# Patient Record
Sex: Female | Born: 1995 | Race: Black or African American | Hispanic: No | Marital: Single | Smoking: Never smoker
Health system: Southern US, Community
[De-identification: ages and names within clinical notes are randomized; demographics above are authoritative.]

## PROBLEM LIST (undated history)

## (undated) DIAGNOSIS — A6000 Herpesviral infection of urogenital system, unspecified: Secondary | ICD-10-CM

## (undated) DIAGNOSIS — N83209 Unspecified ovarian cyst, unspecified side: Secondary | ICD-10-CM

## (undated) DIAGNOSIS — N946 Dysmenorrhea, unspecified: Secondary | ICD-10-CM

## (undated) HISTORY — DX: Dysmenorrhea, unspecified: N94.6

## (undated) HISTORY — DX: Herpesviral infection of urogenital system, unspecified: A60.00

## (undated) HISTORY — PX: DILATION AND CURETTAGE OF UTERUS: SHX78

## (undated) HISTORY — DX: Unspecified ovarian cyst, unspecified side: N83.209

---

## 2014-02-03 ENCOUNTER — Encounter (HOSPITAL_COMMUNITY): Payer: Self-pay | Admitting: Emergency Medicine

## 2014-02-03 ENCOUNTER — Emergency Department (HOSPITAL_COMMUNITY)
Admission: EM | Admit: 2014-02-03 | Discharge: 2014-02-03 | Disposition: A | Discharge status: Home / Self Care | Payer: No Typology Code available for payment source | Attending: Emergency Medicine | Admitting: Emergency Medicine

## 2014-02-03 DIAGNOSIS — IMO0002 Reserved for concepts with insufficient information to code with codable children: Secondary | ICD-10-CM | POA: Insufficient documentation

## 2014-02-03 DIAGNOSIS — Y92410 Unspecified street and highway as the place of occurrence of the external cause: Secondary | ICD-10-CM | POA: Diagnosis not present

## 2014-02-03 DIAGNOSIS — S8990XA Unspecified injury of unspecified lower leg, initial encounter: Secondary | ICD-10-CM | POA: Diagnosis present

## 2014-02-03 DIAGNOSIS — Y9389 Activity, other specified: Secondary | ICD-10-CM | POA: Diagnosis not present

## 2014-02-03 DIAGNOSIS — M79661 Pain in right lower leg: Secondary | ICD-10-CM

## 2014-02-03 DIAGNOSIS — S298XXA Other specified injuries of thorax, initial encounter: Secondary | ICD-10-CM | POA: Insufficient documentation

## 2014-02-03 DIAGNOSIS — S99919A Unspecified injury of unspecified ankle, initial encounter: Principal | ICD-10-CM

## 2014-02-03 DIAGNOSIS — S199XXA Unspecified injury of neck, initial encounter: Secondary | ICD-10-CM

## 2014-02-03 DIAGNOSIS — S0993XA Unspecified injury of face, initial encounter: Secondary | ICD-10-CM | POA: Insufficient documentation

## 2014-02-03 DIAGNOSIS — S99929A Unspecified injury of unspecified foot, initial encounter: Principal | ICD-10-CM

## 2014-02-03 DIAGNOSIS — R0789 Other chest pain: Secondary | ICD-10-CM

## 2014-02-03 MED ORDER — IBUPROFEN 800 MG PO TABS: 800.0000 mg | ORAL_TABLET | Freq: Three times a day (TID) | ORAL | Status: DC | PRN

## 2014-02-03 NOTE — ED Provider Notes (Signed)
Medical screening examination/treatment/procedure(s) were performed by non-physician practitioner and as supervising physician I was immediately available for consultation/collaboration.    Jaden Abreu D Lesle Faron, MD 02/03/14 1904 
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
## 2014-02-03 NOTE — Discharge Instructions (Signed)
Read the information below.  Use the prescribed medication as directed.  Please discuss all new medications with your pharmacist.  You may return to the Emergency Department at any time for worsening condition or any new symptoms that concern you.  If there is any possibility that you might be pregnant, please let your health care provider know and discuss this with the pharmacist to ensure medication safety.  If you develop worsening chest wall pain, shortness of breath, fever, you pass out, or become weak or dizzy, return to the ER for a recheck.     Musculoskeletal Pain Musculoskeletal pain is muscle and boney aches and pains. These pains can occur in any part of the body. Your caregiver may treat you without knowing the cause of the pain. They may treat you if blood or urine tests, X-rays, and other tests were normal.  CAUSES There is often not a definite cause or reason for these pains. These pains may be caused by a type of germ (virus). The discomfort may also come from overuse. Overuse includes working out too hard when your body is not fit. Boney aches also come from weather changes. Bone is sensitive to atmospheric pressure changes. HOME CARE INSTRUCTIONS   Ask when your test results will be ready. Make sure you get your test results.  Only take over-the-counter or prescription medicines for pain, discomfort, or fever as directed by your caregiver. If you were given medications for your condition, do not drive, operate machinery or power tools, or sign legal documents for 24 hours. Do not drink alcohol. Do not take sleeping pills or other medications that may interfere with treatment.  Continue all activities unless the activities cause more pain. When the pain lessens, slowly resume normal activities. Gradually increase the intensity and duration of the activities or exercise.  During periods of severe pain, bed rest may be helpful. Lay or sit in any position that is comfortable.  Putting  ice on the injured area.  Put ice in a bag.  Place a towel between your skin and the bag.  Leave the ice on for 15 to 20 minutes, 3 to 4 times a day.  Follow up with your caregiver for continued problems and no reason can be found for the pain. If the pain becomes worse or does not go away, it may be necessary to repeat tests or do additional testing. Your caregiver may need to look further for a possible cause. SEEK IMMEDIATE MEDICAL CARE IF:  You have pain that is getting worse and is not relieved by medications.  You develop chest pain that is associated with shortness or breath, sweating, feeling sick to your stomach (nauseous), or throw up (vomit).  Your pain becomes localized to the abdomen.  You develop any new symptoms that seem different or that concern you. MAKE SURE YOU:   Understand these instructions.  Will watch your condition.  Will get help right away if you are not doing well or get worse. Document Released: 04/28/2005 Document Revised: 07/21/2011 Document Reviewed: 12/31/2012 [facility] Patient Information 2015 [place], [place]. This information is not intended to replace advice given to you by your health care provider. Make sure you discuss any questions you have with your health care provider.    Emergency Department Resource Guide 1) Find a Doctor and Pay Out of Pocket Although you won't have to find out who is covered by your insurance plan, it is a good idea to ask around and get recommendations. You will  then need to call the office and see if the doctor you have chosen will accept you as a new patient and what types of options they offer for patients who are self-pay. Some doctors offer discounts or will set up payment plans for their patients who do not have insurance, but you will need to ask so you aren't surprised when you get to your appointment.  2) Contact Your Local Health Department Not all health departments have doctors that can see patients for  sick visits, but many do, so it is worth a call to see if yours does. If you don't know where your local health department is, you can check in your phone book. The CDC also has a tool to help you locate your state's health department, and many state websites also have listings of all of their local health departments.  3) Find a Walk-in Clinic If your illness is not likely to be very severe or complicated, you may want to try a walk in clinic. These are popping up all over the country in pharmacies, drugstores, and shopping centers. They're usually staffed by nurse practitioners or physician assistants that have been trained to treat common illnesses and complaints. They're usually fairly quick and inexpensive. However, if you have serious medical issues or chronic medical problems, these are probably not your best option.  No Primary Care Doctor: - Call Health Connect at  [phone] - they can help you locate a primary care doctor that  accepts your insurance, provides certain services, etc. - Physician Referral Service- [phone]  Chronic Pain Problems: Organization         Address  Phone   Notes  [facility]  [phone] Patients need to be referred by their primary care doctor.   Medication Assistance: Organization         Address  Phone   Notes  [facility] Medication [facility] [address] [place], [place] [address] [phone] --Must be a resident of [facility] -- Must have NO insurance coverage whatsoever (no Medicaid/ Medicare, etc.) -- The pt. MUST have a primary care doctor that directs their care regularly and follows them in the community   [facility]ist  [phone]   [employer]  [phone]    Agencies that provide inexpensive medical care: Organization         Address  Phone   Notes  [facility]  [phone]   [facility]    [phone]   [facility]  [address] [place], [place] [address] [phone]   [facility] [address] [place] [place] [phone]   Planned Parenthood    [phone]   [facility]    [phone]   [facility]  [address], [place] Phone:  [phone], Fax:  [phone] Hours of Operation:  9 am - 6 pm, M-F.  Also accepts Medicaid/Medicare and self-pay.  [facility]  [address], [place] Phone: [phone], Fax: [phone]. Hours of Operation:  8:30 am - 5:30 pm, M-F.  Also accepts Medicaid and self-pay.  [facility] [place] [address], [place] Phone: [phone]   [facility] [address] Natasha Bence [place], [place] [phone], Ext. [phone] Mondays & Thursdays: 7-9 AM.  First 15 patients are seen on a first come, first serve  basis.    Medicaid-accepting [facility] Providers:  Organization         Address  Phone   Notes  [facility] [address], [place] [phone] Also accepts self-pay patients.  [facility] [address] Laurell Josephs [place], [place]  [phone]   [facility] [place] [facility] [address], [place] [phone]   [facility] [address], [place] [phone]   Renaye Rakers [address], [place]   [phone] Only accepts [place] Access [place] patients after they have their name applied to their card.   Self-Pay (no insurance) in [facility]:  Organization         Address  Phone   Notes  Sickle Cell Patients, [facility] [address] [place], [place] [phone]   [facility] [address] [place], [place] [phone]   [facility] [place]  [address], [place] [phone]   [facility]/Dr. Osei-Bonsu  [address], [place] or [address], [place] [phone] Phone number for both [place] and [place] locations is the same.  [facility] and [facility] [address] [place] [phone]   [facility] [address], [place] or [address] [phone]   [facility] [address] [place] [phone], phone; [phone], fax Sees patients 1st and 3rd Saturday of every month.  Must not qualify for public or private insurance (i.e. Medicaid, Medicare, [place] Health Choice, Veterans' Benefits)  Household income should be no more than 200% of the poverty level The clinic cannot treat you if you are pregnant or think you are pregnant  Sexually transmitted diseases are not treated at the clinic.    Dental Care: Organization         Address  Phone  Notes  [facility] Department of [facility] [address] [place], [place] [phone] Accepts children up to age 74 who are enrolled in [place] or [place] Health Choice; pregnant women with a Medicaid card; and children who have applied for Medicaid or [place] Health Choice, but were declined, whose parents can pay a reduced fee at time of service.  [facility] of [facility]  [address], [place] [phone] Accepts children up to age 52 who are enrolled in [place] or [place] Health Choice; pregnant women with a Medicaid card; and children who have applied for Medicaid or [place] Health Choice, but were declined, whose parents can pay a reduced fee at time of service.  [facility]  [address] [place], [place] [phone] Patients are seen by appointment only. Walk-ins are not accepted. [facility] will see patients 23 years of age and older. Monday - Tuesday (8am-5pm) Most Wednesdays (8:30-5pm) $30 per visit, cash only  [facility]  [address], [facility] [phone] Patients are seen by appointment only. Walk-ins are not accepted. [facility] will see patients 79 years of age and older. One Wednesday Evening (Monthly: Volunteer Based).  $30 per visit, cash only  [employer]  [phone] for adults; Children under age 24, call Graduate Pediatric Dentistry at [phone]. Children aged 59-14, please call [phone] to request a pediatric application.  Dental services are provided in all areas of dental care including fillings, crowns and bridges, complete and partial dentures, implants, gum treatment, root canals, and extractions. Preventive care is also provided. Treatment is provided to both adults and children. Patients are selected via a lottery and there is often a waiting list.   [facility] [address] [place]  [phone] [email or web address]   [facility] [address] [place], [place] [phone], Ext. [phone] Second and Fourth Thursday of each month, opens at 6:30 AM; Clinic ends at 9 AM.  Patients are seen on a first-come first-served basis, and a limited number are seen during each clinic.   [facility]  [address] Ether Griffins [place], [place] [phone]   Eligibility Requirements You must have lived in [place], [place], or [place] counties for at least the last three months.   You cannot be eligible for state or federal sponsored [place], including [employer], [place], or [employer].   You generally cannot be eligible for healthcare insurance through your employer.    How to apply: Eligibility screenings are held every Tuesday and Wednesday afternoon from 1:00 pm until 4:00 pm. You do not need an appointment for the interview!  [facility] [address], [place], [place] [phone]   [facility] Health Department  [phone]   [facility] Health Department  [phone]     [facility] Health Department  [phone]    Behavioral Health Resources in the Community: Intensive Outpatient Programs Organization         Address  Phone  Notes  [facility] [address], [place], [place] [phone]   [facility] Outpatient [address], [place], [place] [phone]   [facility] [address] [place], [place]  [phone]   [facility] Mental Health [address]  [place], [place] [phone] or [phone]   Substance Abuse Resources Organization         Address  Phone  Notes  [facility] Drug [facility]  [phone]   [facility]  [phone]   [facility] [place]  [phone]   Floydene Flock  [phone]   Residential & Outpatient Substance Abuse Program  [phone]   Psychological Services Organization         Address  Phone  Notes  [facility] Behavioral Health  [phone]   [facility]  [phone]   [facility] Mental Health [address], [place] [phone] or [phone]    Mobile Crisis Teams Organization         Address  Phone  Notes  [facility], Mobile Crisis Care Unit  [phone]   [facility]  [address] [place], [place] [phone]   Doristine Locks [address] [place] [phone]    Self-Help/Support Groups Organization         Address  Phone             Notes  [facility] [place] - variety of support groups  [phone]- I7437963 Call for more information  Narcotics Anonymous (NA), [facility] [address], [employer] [place]  2 meetings at this location   Statistician         Address  Phone  Notes  [facility] [address],    [place]  [phone]   [facility]  [phone], [place], [place] [phone]   [facility] [address], [place]  [phone] Admissions: 8am-3pm M-F  [facility] [address]    [place], [place] [phone]   [facility] [address], [place], [place] [phone]   [facility] [address]  [place], [place] [phone]   [facility] - Intensive Outpatient [address], [place], [place] [phone]   [facility] ([facility] [address]  [place], [place] [phone] or [phone]   [facility] ([facility]) [address] [place], [place] [phone] Accepts Medicaid  [facility] [address]  [place] [phone] Substance Abuse/Addiction Treatment   [facility] Behavioral Health Resources Organization         Address  Phone  Notes  [facility]  [phone](888) [phone]   Angie FavaJulie Brannon, PhD [address] [place], [place]   [phone](336) [phone] or [phone]) [phone]   [facility]   [address] [place], [place] [phone]) [phone]   [facility] [address], [place], [place] [phone]) [phone] Insurance/Medicaid/sponsorship through [facility]  [facility] [address]                                    [place], [place] [phone]) [phone] Therapy/tele-psych/case  [facility] [address]   [place], [place] [phone]) [phone]    Dr. Lolly MustacheArfeen  [phone]) [phone]   [facility]  [employer] [facility] 1) [address], [place] 2) [address], [place] 3)  [address], [place] [phone](336) [phone]  [phone](336) [phone]   [facility] Child Abuse Hotline [phone]) [phone] or [phone]) [phone] (After Hours)

## 2014-02-17 ENCOUNTER — Ambulatory Visit (INDEPENDENT_AMBULATORY_CARE_PROVIDER_SITE_OTHER): Payer: PRIVATE HEALTH INSURANCE | Admitting: Family Medicine

## 2014-02-17 VITALS — BP 110/70 | HR 100 | Temp 100.2°F | Resp 18 | Ht 62.0 in | Wt 128.0 lb

## 2014-02-17 DIAGNOSIS — R509 Fever, unspecified: Secondary | ICD-10-CM

## 2014-02-17 DIAGNOSIS — N10 Acute tubulo-interstitial nephritis: Secondary | ICD-10-CM

## 2014-02-17 DIAGNOSIS — M545 Low back pain, unspecified: Secondary | ICD-10-CM

## 2014-02-17 DIAGNOSIS — R300 Dysuria: Secondary | ICD-10-CM

## 2014-02-17 LAB — POCT CBC
Granulocyte percent: 85.3 %G — AB (ref 37–80)
HCT, POC: 39.6 % (ref 37.7–47.9)
Hemoglobin: 12.6 g/dL (ref 12.2–16.2)
Lymph, poc: 1.9 (ref 0.6–3.4)
MCH: 29.4 pg (ref 27–31.2)
MCHC: 31.9 g/dL (ref 31.8–35.4)
MCV: 92.1 fL (ref 80–97)
MID (cbc): 0.8 (ref 0–0.9)
MPV: 8.8 fL (ref 0–99.8)
PLATELET COUNT, POC: 324 10*3/uL (ref 142–424)
POC Granulocyte: 15.9 — AB (ref 2–6.9)
POC LYMPH %: 10.2 % (ref 10–50)
POC MID %: 4.5 % (ref 0–12)
RBC: 4.3 M/uL (ref 4.04–5.48)
RDW, POC: 14.6 %
WBC: 18.6 10*3/uL — AB (ref 4.6–10.2)

## 2014-02-17 LAB — POCT URINALYSIS DIPSTICK
Bilirubin, UA: NEGATIVE
GLUCOSE UA: NEGATIVE
KETONES UA: NEGATIVE
Nitrite, UA: NEGATIVE
PROTEIN UA: 100
SPEC GRAV UA: 1.015
UROBILINOGEN UA: 0.2
pH, UA: 5.5

## 2014-02-17 LAB — POCT UA - MICROSCOPIC ONLY
Casts, Ur, LPF, POC: NEGATIVE
Crystals, Ur, HPF, POC: NEGATIVE
MUCUS UA: NEGATIVE
YEAST UA: NEGATIVE

## 2014-02-17 MED ORDER — CIPROFLOXACIN HCL 500 MG PO TABS: 500.0000 mg | ORAL_TABLET | Freq: Two times a day (BID) | ORAL | Status: DC

## 2014-02-17 MED ORDER — CEFTRIAXONE SODIUM 1 G IJ SOLR
1.0000 g | Freq: Once | INTRAMUSCULAR | Status: AC
  Administered 2014-02-17: 1 g via INTRAMUSCULAR

## 2014-02-17 MED ORDER — PHENAZOPYRIDINE HCL 200 MG PO TABS: 200.0000 mg | ORAL_TABLET | Freq: Three times a day (TID) | ORAL | Status: DC | PRN

## 2014-02-17 NOTE — Progress Notes (Signed)
Subjective:    Patient ID: Terry Gill, female    DOB: 1995/06/05, 18 y.o.   MRN: [id number]  HPI This is a pleasant 18 yo female who presents today with 2 day history of frequent urination and lower abdominal pain. She got some OTC cranberry pills and she increased her fluids. She took ibuprofen 600 mg without relief. No previous cystitis/pyelonephritis.   She is currently sexually active, she uses condoms every time. She is currently menstruating.    No past medical history on file. No past surgical history on file. History  Substance Use Topics  . Smoking status: Never Smoker   . Smokeless tobacco: Not on file  . Alcohol Use: No     Comment: occ   No family history on file.  Review of Systems +fever, + chills, No nausea/vomiting, no vaginal discharge, no itching,     Objective:   Physical Exam  Vitals reviewed. Constitutional: She is oriented to person, place, and time. She appears well-developed and well-nourished.  HENT:  Head: Normocephalic and atraumatic.  Eyes: Conjunctivae are normal.  Neck: Normal range of motion. Neck supple.  Cardiovascular: Normal rate, regular rhythm and normal heart sounds.   Pulmonary/Chest: Effort normal and breath sounds normal.  Abdominal: Soft. Bowel sounds are normal. She exhibits no distension and no mass. There is tenderness in the suprapubic area. There is CVA tenderness. There is no rebound and no guarding.  Musculoskeletal: Normal range of motion.  Neurological: She is alert and oriented to person, place, and time.  Skin: Skin is warm and dry.  Psychiatric: She has a normal mood and affect. Her behavior is normal. Judgment and thought content normal.   Results for orders placed in visit on 02/17/14  POCT URINALYSIS DIPSTICK      Result Value Ref Range   Color, UA light yellow     Clarity, UA slightly cloudy     Glucose, UA neg     Bilirubin, UA neg     Ketones, UA neg     Spec Grav, UA 1.015     Blood, UA large     pH, UA 5.5     Protein, UA 100     Urobilinogen, UA 0.2     Nitrite, UA neg     Leukocytes, UA moderate (2+)    POCT UA - MICROSCOPIC ONLY      Result Value Ref Range   WBC, Ur, HPF, POC 10-15     RBC, urine, microscopic 20-25     Bacteria, U Microscopic trace     Mucus, UA neg     Epithelial cells, urine per micros 1-3     Crystals, Ur, HPF, POC neg     Casts, Ur, LPF, POC neg     Yeast, UA neg    POCT CBC      Result Value Ref Range   WBC 18.6 (*) 4.6 - 10.2 K/uL   Lymph, poc 1.9  0.6 - 3.4   POC LYMPH PERCENT 10.2  10 - 50 %L   MID (cbc) 0.8  0 - 0.9   POC MID % 4.5  0 - 12 %M   POC Granulocyte 15.9 (*) 2 - 6.9   Granulocyte percent 85.3 (*) 37 - 80 %G   RBC 4.30  4.04 - 5.48 M/uL   Hemoglobin 12.6  12.2 - 16.2 g/dL   HCT, POC 40.939.6  81.137.7 - 47.9 %   MCV 92.1  80 - 97 fL   MCH,  POC 29.4  27 - 31.2 pg   MCHC 31.9  31.8 - 35.4 g/dL   RDW, POC 16.114.6     Platelet Count, POC 324  142 - 424 K/uL   MPV 8.8  0 - 99.8 fL       Assessment & Plan:  Discussed with Dr. Conley RollsLe 1. Dysuria - POCT urinalysis dipstick - POCT UA - Microscopic Only - Urine culture  2. Fever, unspecified fever cause - POCT urinalysis dipstick - POCT UA - Microscopic Only  3. Acute pyelonephritis -Provided written and verbal information regarding diagnosis and treatment. - POCT CBC - Urine culture - cefTRIAXone (ROCEPHIN) injection 1 g; Inject 1 g into the muscle once. - ciprofloxacin (CIPRO) 500 MG tablet; Take 1 tablet (500 mg total) by mouth 2 (two) times daily.  Dispense: 20 tablet; Refill: 0  4. Right-sided low back pain without sciatica - POCT urinalysis dipstick - POCT UA - Microscopic Only  -As long as patient is improving, will do telephone follow up on 02/20/2014, if she worsens, she is to return immediately or go to ER. Patient verbalized understanding.   Emi Belfasteborah B. Tavyn Kurka, FNP-BC  [facility]  02/17/2014 7:07 PM

## 2014-02-17 NOTE — Patient Instructions (Signed)
Return or go to ER if your symptoms get worse or you have uncontrolled vomiting Drink enough fluids to make your urine clear/light yellow  Pyelonephritis, Adult Pyelonephritis is a kidney infection. In general, there are 2 main types of pyelonephritis:  Infections that come on quickly without any warning (acute pyelonephritis).  Infections that persist for a long period of time (chronic pyelonephritis). CAUSES  Two main causes of pyelonephritis are:  Bacteria traveling from the bladder to the kidney. This is a problem especially in pregnant women. The urine in the bladder can become filled with bacteria from multiple causes, including:  Inflammation of the prostate gland (prostatitis).  Sexual intercourse in females.  Bladder infection (cystitis).  Bacteria traveling from the bloodstream to the tissue part of the kidney. Problems that may increase your risk of getting a kidney infection include:  Diabetes.  Kidney stones or bladder stones.  Cancer.  Catheters placed in the bladder.  Other abnormalities of the kidney or ureter. SYMPTOMS   Abdominal pain.  Pain in the side or flank area.  Fever.  Chills.  Upset stomach.  Blood in the urine (dark urine).  Frequent urination.  Strong or persistent urge to urinate.  Burning or stinging when urinating. DIAGNOSIS  Your caregiver may diagnose your kidney infection based on your symptoms. A urine sample may also be taken. TREATMENT  In general, treatment depends on how severe the infection is.   If the infection is mild and caught early, your caregiver may treat you with oral antibiotics and send you home.  If the infection is more severe, the bacteria may have gotten into the bloodstream. This will require intravenous (IV) antibiotics and a hospital stay. Symptoms may include:  High fever.  Severe flank pain.  Shaking chills.  Even after a hospital stay, your caregiver may require you to be on oral  antibiotics for a period of time.  Other treatments may be required depending upon the cause of the infection. HOME CARE INSTRUCTIONS   Take your antibiotics as directed. Finish them even if you start to feel better.  Make an appointment to have your urine checked to make sure the infection is gone.  Drink enough fluids to keep your urine clear or pale yellow.  Take medicines for the bladder if you have urgency and frequency of urination as directed by your caregiver. SEEK IMMEDIATE MEDICAL CARE IF:   You have a fever or persistent symptoms for more than 2-3 days.  You have a fever and your symptoms suddenly get worse.  You are unable to take your antibiotics or fluids.  You develop shaking chills.  You experience extreme weakness or fainting.  There is no improvement after 2 days of treatment. MAKE SURE YOU:  Understand these instructions.  Will watch your condition.  Will get help right away if you are not doing well or get worse. Document Released: 04/28/2005 Document Revised: 10/28/2011 Document Reviewed: 10/02/2010 [facility] Patient Information 2015 [employer] [place]. This information is not intended to replace advice given to you by your health care provider. Make sure you discuss any questions you have with your health care provider.

## 2014-02-21 ENCOUNTER — Telehealth: Payer: Self-pay

## 2014-02-21 LAB — URINE CULTURE: Colony Count: >=100000

## 2014-02-21 NOTE — Telephone Encounter (Signed)
Pt states she was feeling pretty good until last night she experienced some lower back pain and urgency again. She has not completed the Cipro yet but has been taking as prescribed. Pt would like some advise- she does not have insurance at this time she would have to pay cash.

## 2014-02-21 NOTE — Telephone Encounter (Signed)
Pt states she was seen for a kidney infection and was during good, the Dr called to check on her yesterday and all was fine, however last night the symptoms came back Please call [phone]

## 2014-02-21 NOTE — Telephone Encounter (Signed)
Called patient who reports she had right sided pain that returned last night and continued this morning. She currently feels "pretty good," with no pain. Pain coming and going. She has not taken anything for pain. No fever or chills. No dysuria, no hematuria. Diarrhea 2-3 episodes a day otherwise tolerating Cipro. No nausea or vomiting. I instructed her to try ibuprofen/acetaminophen for back pain. RTC/follow up if return of fever/chills, dysuria or back pain not resolved with otc analgesics.

## 2014-09-14 ENCOUNTER — Emergency Department (HOSPITAL_COMMUNITY)
Admission: EM | Admit: 2014-09-14 | Discharge: 2014-09-14 | Disposition: A | Discharge status: Home / Self Care | Payer: 59 | Attending: Emergency Medicine | Admitting: Emergency Medicine

## 2014-09-14 ENCOUNTER — Encounter (HOSPITAL_COMMUNITY): Payer: Self-pay | Admitting: *Deleted

## 2014-09-14 DIAGNOSIS — Z792 Long term (current) use of antibiotics: Secondary | ICD-10-CM | POA: Insufficient documentation

## 2014-09-14 DIAGNOSIS — H01001 Unspecified blepharitis right upper eyelid: Secondary | ICD-10-CM | POA: Diagnosis not present

## 2014-09-14 DIAGNOSIS — J029 Acute pharyngitis, unspecified: Secondary | ICD-10-CM | POA: Insufficient documentation

## 2014-09-14 DIAGNOSIS — H5711 Ocular pain, right eye: Secondary | ICD-10-CM | POA: Diagnosis present

## 2014-09-14 MED ORDER — CIPROFLOXACIN HCL 0.3 % OP OINT: TOPICAL_OINTMENT | Freq: Two times a day (BID) | OPHTHALMIC | Status: AC

## 2014-09-14 NOTE — ED Notes (Signed)
Declined W/C at D/C and was escorted to lobby by RN. 

## 2014-09-14 NOTE — Discharge Instructions (Signed)
Blepharitis Blepharitis is redness, soreness, and swelling (inflammation) of one or both eyelids. It may be caused by an allergic reaction or a bacterial infection. Blepharitis may also be associated with reddened, scaly skin (seborrhea) of the scalp and eyebrows. While you sleep, eye discharge may cause your eyelashes to stick together. Your eyelids may itch, burn, swell, and may lose their lashes. These will grow back. Your eyes may become sensitive. Blepharitis may recur and need repeated treatment. If this is the case, you may require further evaluation by an eye specialist (ophthalmologist). HOME CARE INSTRUCTIONS   Keep your hands clean.  Use a clean towel each time you dry your eyelids. Do not use this towel to clean other areas. Do not share a towel or makeup with anyone.  Wash your eyelids with warm water or warm water mixed with a small amount of baby shampoo. Do this twice a day or as often as needed.  Wash your face and eyebrows at least once a day.  Use warm compresses 2 times a day for 10 minutes at a time, or as directed by your caregiver.  Apply antibiotic ointment as directed by your caregiver.  Avoid rubbing your eyes.  Avoid wearing makeup until you get better.  Follow up with your caregiver as directed. SEEK IMMEDIATE MEDICAL CARE IF:   You have pain, redness, or swelling that gets worse or spreads to other parts of your face.  Your vision changes, or you have pain when looking at lights or moving objects.  You have a fever.  Your symptoms continue for longer than 2 to 4 days or become worse. MAKE SURE YOU:   Understand these instructions.  Will watch your condition.  Will get help right away if you are not doing well or get worse. Document Released: 04/25/2000 Document Revised: 07/21/2011 Document Reviewed: 06/05/2010 [employer] Patient Information 2015 [employer]. This information is not intended to replace advice given to you by your health care  provider. Make sure you discuss any questions you have with your health care provider.  

## 2014-09-14 NOTE — ED Provider Notes (Signed)
CSN: [id number]     Arrival date & time 09/14/14  2044 History  This chart was scribed for non-physician practitioner, Fayrene HelperBowie Mays Paino, PA-C, working with Blake DivineJohn Wofford, MD, by Modena JanskyAlbert Thayil, ED Scribe. This patient was seen in room TR08C/TR08C and the patient's care was started at 8:58 PM.   Chief Complaint  Patient presents with  . Eye Pain   The history is provided by the patient. No language interpreter was used.   HPI Comments: Terry Gill is a 19 y.o. female who presents to the Emergency Department complaining of constant moderate right eye pain that started a few days ago. She states that the right eye pain started a few days ago. She reports that the pain worsened and had a throbbing sensation. She states that then she could not open her right eye this morning due to right eye swelling and crusting. She denies any recent injury or trauma. She reports that moving her right eye and blinking exacerbates the pain. She states that she also has a sore throat. She denies any use of contacts. She also denies any eye itchiness, visual disturbance, or neck pain.  History reviewed. No pertinent past medical history. History reviewed. No pertinent past surgical history. History reviewed. No pertinent family history. History  Substance Use Topics  . Smoking status: Never Smoker   . Smokeless tobacco: Never Used  . Alcohol Use: No     Comment: occ   OB History    No data available     Review of Systems  HENT: Positive for sore throat.   Eyes: Positive for pain. Negative for itching and visual disturbance.  Musculoskeletal: Negative for neck pain.    Allergies  Review of patient's allergies indicates no known allergies.  Home Medications   Prior to Admission medications   Medication Sig Start Date End Date Taking? Authorizing Provider  ciprofloxacin (CIPRO) 500 MG tablet Take 1 tablet (500 mg total) by mouth 2 (two) times daily. 02/17/14   Emi Belfasteborah B Gessner, FNP  phenazopyridine  (PYRIDIUM) 200 MG tablet Take 1 tablet (200 mg total) by mouth 3 (three) times daily as needed for pain. 02/17/14   Emi Belfasteborah B Gessner, FNP   BP 115/88 mmHg  Pulse 99  Temp(Src) 99.8 F (37.7 C) (Oral)  Resp 22  SpO2 99% Physical Exam  Constitutional: She is oriented to person, place, and time. She appears well-developed and well-nourished. No distress.  HENT:  Head: Normocephalic and atraumatic.  Mouth: Medial gum adjacent to second molar is edematous and erythematous. No abscess or trismus. No lymphadenopathy.     Eyes: EOM are normal. Pupils are equal, round, and reactive to light.  Lids everted, no foreign objects. Right upper eyelid is edematous with no erythema. No chalazion, or hordiolum.  Visual Acuity - Bilateral Near: 20/15 ; Bilateral Distance: 20/20 ; R Near: 20/15 ; R Distance: 20/40   Neck: Neck supple. No tracheal deviation present.  Cardiovascular: Normal rate.   Pulmonary/Chest: Effort normal. No respiratory distress.  Musculoskeletal: Normal range of motion.  Neurological: She is alert and oriented to person, place, and time.  Skin: Skin is warm and dry.  Psychiatric: She has a normal mood and affect. Her behavior is normal.  Nursing note and vitals reviewed.   ED Course  Procedures (including critical care time) DIAGNOSTIC STUDIES: Oxygen Saturation is 99% on RA, normal by my interpretation.    COORDINATION OF CARE: 9:02 PM- Pt advised of plan for treatment and pt agrees.  Evidence of blepharitis.  No stye or chalazion.  No eye involvement.  Normal visual acuity.  Recommend warm compress.  abx provided ophthalmology referral given as needed.   Labs Review Labs Reviewed - No data to display  Imaging Review No results found.   EKG Interpretation None      MDM   Final diagnoses:  Blepharitis of right upper eyelid    BP 115/88 mmHg  Pulse 99  Temp(Src) 99.8 F (37.7 C) (Oral)  Resp 22  SpO2 99%  LMP 09/10/2014  I personally performed the  services described in this documentation, which was scribed in my presence. The recorded information has been reviewed and is accurate.      Fayrene HelperBowie Sava Proby, PA-C 09/14/14 2118  Blake DivineJohn Wofford, MD 09/16/14 [phone]

## 2014-09-14 NOTE — ED Notes (Signed)
Pt reports pain started 1 week ago. Pain has increased and Rt upper lid is swolen. Pt does not use contacts.

## 2015-01-20 ENCOUNTER — Ambulatory Visit (INDEPENDENT_AMBULATORY_CARE_PROVIDER_SITE_OTHER): Payer: PRIVATE HEALTH INSURANCE | Admitting: Family Medicine

## 2015-01-20 ENCOUNTER — Ambulatory Visit (INDEPENDENT_AMBULATORY_CARE_PROVIDER_SITE_OTHER): Payer: PRIVATE HEALTH INSURANCE

## 2015-01-20 VITALS — BP 100/68 | HR 82 | Temp 98.4°F | Resp 18 | Ht 61.0 in | Wt 150.0 lb

## 2015-01-20 DIAGNOSIS — M25572 Pain in left ankle and joints of left foot: Secondary | ICD-10-CM

## 2015-01-20 DIAGNOSIS — S93402A Sprain of unspecified ligament of left ankle, initial encounter: Secondary | ICD-10-CM | POA: Diagnosis not present

## 2015-01-20 NOTE — Patient Instructions (Signed)

## 2015-01-20 NOTE — Progress Notes (Signed)
Subjective:    Patient ID: Terry Gill, female    DOB: May 03, 1996, 19 y.o.   MRN: [id number] This chart was scribed for Norberto Sorenson, MD by Jolene Provost, Medical Scribe. This patient was seen in Room 1 and the patient's care was started a 2:34 PM.  Chief Complaint  Patient presents with  . Ankle Pain    left- x1 mth no injury     HPI HPI Comments: Terry Gill is a 19 y.o. female who presents to [facility] complaining of left ankle pain for the last month. She denies any known MOI. Her pain started as a cramp on the proximal dorsal aspect of the foot and after a week it radiated to the lateral aspect. Foot is everting, and she has to rotate it medially as she walks in order to self correct but then people notice that she is walking funny. She has no prior hx of ankle pain or prior injury.  She has not tried anything for this - no nsaids, bracing, ice, etc.   She works at [employer] and so is on her feet for very long hours, wears crocs at work and flip flops when note. Her mom told her it was due to her shoes but she has not tried to change them.  History reviewed. No pertinent past medical history. Current Outpatient Prescriptions on File Prior to Visit  Medication Sig Dispense Refill  . norgestimate-ethinyl estradiol (ORTHO-CYCLEN,SPRINTEC,PREVIFEM) 0.25-35 MG-MCG tablet Take 1 tablet by mouth daily.     No current facility-administered medications on file prior to visit.   No Known Allergies  Depression screen PHQ 2/9 01/20/2015  Decreased Interest 0  Down, Depressed, Hopeless 0  PHQ - 2 Score 0     Review of Systems  Constitutional: Negative for fever, chills, activity change, appetite change and unexpected weight change.  Cardiovascular: Negative for leg swelling.  Gastrointestinal: Negative for abdominal pain.  Musculoskeletal: Positive for myalgias, arthralgias and gait problem. Negative for back pain and joint swelling.  Skin: Negative for color change and wound.    Neurological: Positive for weakness (Secondary to pain). Negative for tremors, syncope and numbness.  Hematological: Does not bruise/bleed easily.  Psychiatric/Behavioral: Negative for dysphoric mood.       Objective:  BP 100/68 mmHg  Pulse 82  Temp(Src) 98.4 F (36.9 C) (Oral)  Resp 18  Ht  (1.549 m)  Wt 150 lb (68.04 kg)  BMI 28.36 kg/m2  SpO2 98%  LMP 12/23/2014  Physical Exam  Constitutional: She is oriented to person, place, and time. She appears well-developed and well-nourished. No distress.  HENT:  Head: Normocephalic and atraumatic.  Eyes: Pupils are equal, round, and reactive to light.  Neck: Neck supple.  Cardiovascular: Normal rate.   Pulmonary/Chest: Effort normal. No respiratory distress.  Musculoskeletal: Normal range of motion.  Mild pain on proximal dorsal aspect with palpations. Negative thompson's. No instability with inversion or eversion or achilles stress. No pain along the metatarsals, or with squeeze, TTP over the CF ligament. TTP on the lateral malleolus and on the inferior and anterior aspect. She does seem to be everting her left foot by 10 degrees, ankles are rolling out slightly.   Neurological: She is alert and oriented to person, place, and time. Coordination normal.  Skin: Skin is warm and dry. She is not diaphoretic.  Psychiatric: She has a normal mood and affect. Her behavior is normal.  Nursing note and vitals reviewed.  [facility] reading (PRIMARY) by  Dr. Clelia Croft, normal  without acute bony abnormality. Dg Ankle Complete Left  01/20/2015   CLINICAL DATA:  Lateral left ankle pain and dorsal proximal foot pain for 1 month. Initial encounter.  EXAM: LEFT ANKLE COMPLETE - 3+ VIEW  COMPARISON:  None.  FINDINGS: There is no evidence of fracture, dislocation, or joint effusion. There is no evidence of arthropathy or other focal bone abnormality. Soft tissues are unremarkable.  IMPRESSION: Negative.   Electronically Signed   By: Sebastian Ache M.D.   On:  01/20/2015 15:23       Assessment & Plan:   1. Pain in joint, ankle and foot, left   2. 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  Placed pt in swede-o brace to provide additional support and try icing after shifts.  If pain cont, cons podiatry - may need orthotics.  Orders Placed This Encounter  Procedures  . DG Ankle Complete Left    Standing Status: Future     Number of Occurrences: 1     Standing Expiration Date: 01/20/2016    Order Specific Question:  Reason for Exam (SYMPTOM  OR DIAGNOSIS REQUIRED)    Answer:  pain on lateral malleoulus and dorsum of proximal foot, cf lig tender    Order Specific Question:  Is the patient pregnant?    Answer:  No    Order Specific Question:  Preferred imaging location?    Answer:  External    I personally performed the services described in this documentation, which was scribed in my presence. The recorded information has been reviewed and considered, and addended by me as needed.  Norberto Sorenson, MD MPH

## 2015-11-06 ENCOUNTER — Ambulatory Visit (INDEPENDENT_AMBULATORY_CARE_PROVIDER_SITE_OTHER): Payer: PRIVATE HEALTH INSURANCE | Admitting: Physician Assistant

## 2015-11-06 VITALS — BP 118/80 | HR 96 | Temp 99.0°F | Resp 18 | Ht 61.0 in | Wt 158.0 lb

## 2015-11-06 DIAGNOSIS — R109 Unspecified abdominal pain: Secondary | ICD-10-CM | POA: Diagnosis not present

## 2015-11-06 DIAGNOSIS — N898 Other specified noninflammatory disorders of vagina: Secondary | ICD-10-CM

## 2015-11-06 LAB — POC MICROSCOPIC URINALYSIS (UMFC)

## 2015-11-06 LAB — POCT URINALYSIS DIP (MANUAL ENTRY)
Bilirubin, UA: NEGATIVE
GLUCOSE UA: NEGATIVE
Nitrite, UA: NEGATIVE
Spec Grav, UA: >=1.03
Urobilinogen, UA: 0.2
pH, UA: 5.5

## 2015-11-06 LAB — POCT WET + KOH PREP
Trich by wet prep: ABSENT
YEAST BY KOH: ABSENT
YEAST BY WET PREP: ABSENT

## 2015-11-06 LAB — POCT URINE PREGNANCY: Preg Test, Ur: NEGATIVE

## 2015-11-06 NOTE — Patient Instructions (Addendum)
     IF you received an x-ray today, [facility] will [facility] an [facility] from [facility]. Please contact [facility] at [phone] with questions or concerns regarding your invoice.   IF you received labwork today, you will receive an invoice from [facility]. Please contact [facility] at [phone] with questions or concerns regarding your invoice.   Our billing staff will not be able to assist you with questions regarding bills from these companies.  You will be contacted with the lab results as soon as they are available. The fastest way to get your results is to activate your My Chart account. Instructions are located on the last page of this paperwork. If you have not heard from us regarding the results in 2 weeks, please contact this office.   I will contact you with the lab results.  We will then discuss a plan at that time.

## 2015-11-06 NOTE — Progress Notes (Signed)
[facility] [address], [place] [id number] [phone]  Date:  11/06/2015   Name:  Terry Gill   DOB:  11-28-1995   MRN:  [id number]  PCP:  No PCP Per Patient    History of Present Illness: Chief Complaint  Patient presents with  . Abdominal Pain    Lower, yesterday. Denies N/V/D   Terry Gill is a 20 y.o. female patient [facility].   No abdnormal vaginal discharge -Patient's last menstrual period was 10/19/2015. -No hematuria, dysuria, or hematuria. -recent ultrasound for hx of severe cramping in the last 4 weeks. -No abnormal vaginal discharge, or odor -No nausea, vomiting, or diarrhea.   -Hx of chlamydia last 4 weeks ago.  Patient's last menstrual period was 10/19/2015.     There are no active problems to display for this patient.   No past medical history on file.  No past surgical history on file.  Social History  Substance Use Topics  . Smoking status: Never Smoker   . Smokeless tobacco: Never Used  . Alcohol Use: No     Comment: occ    No family history on file.  No Known Allergies  Medication list has been reviewed and updated.  Current Outpatient Prescriptions on File Prior to Visit  Medication Sig Dispense Refill  . norgestimate-ethinyl estradiol (ORTHO-CYCLEN,SPRINTEC,PREVIFEM) 0.25-35 MG-MCG tablet Take 1 tablet by mouth daily. Reported on 11/06/2015     No current facility-administered medications on file prior to visit.    ROS ROS otherwise unremarkable unless listed above.   Physical Examination: BP 118/80 mmHg  Pulse 96  Temp(Src) 99 F (37.2 C) (Oral)  Resp 18  Ht 5\' 1"  (1.549 m)  Wt 158 lb (71.668 kg)  BMI 29.87 kg/m2  SpO2 99%  LMP 10/19/2015 Ideal Body Weight: Weight in (lb) to have BMI = 25: 132  Physical Exam  Constitutional: She is oriented to person, place, and time. She appears well-developed and well-nourished. No distress.  HENT:  Head:  Normocephalic and atraumatic.  Right Ear: External ear normal.  Left Ear: External ear normal.  Eyes: Conjunctivae and EOM are normal. Pupils are equal, round, and reactive to light.  Cardiovascular: Normal rate.   Pulmonary/Chest: Effort normal. No respiratory distress.  Abdominal: Soft. Normal appearance and bowel sounds are normal. There is no hepatosplenomegaly. There is tenderness in the suprapubic area. There is CVA tenderness. There is no tenderness at McBurney's point and negative Murphy's sign.  Neurological: She is alert and oriented to person, place, and time.  Skin: She is not diaphoretic.  Psychiatric: She has a normal mood and affect. Her behavior is normal.   Results for orders placed or performed in visit on 11/06/15  GC/Chlamydia Probe Amp  Result Value Ref Range   CT Probe RNA NOT DETECTED    GC Probe RNA NOT DETECTED   POCT urinalysis dipstick  Result Value Ref Range   Color, UA yellow yellow   Clarity, UA clear clear   Glucose, UA negative negative   Bilirubin, UA negative negative   Ketones, POC UA small (15) (A) negative   Spec Grav, UA >=1.030    Blood, UA trace-intact (A) negative   pH, UA 5.5    Protein Ur, POC trace (A) negative   Urobilinogen, UA 0.2    Nitrite, UA Negative Negative   Leukocytes, UA Trace (A) Negative  POCT Microscopic Urinalysis ([facility])  Result Value Ref Range  WBC,UR,HPF,POC Too numerous to count  (A) None WBC/hpf   RBC,UR,HPF,POC Few (A) None RBC/hpf   Bacteria Many (A) None, Too numerous to count   Mucus Present (A) Absent   Epithelial Cells, UR Per Microscopy Moderate (A) None, Too numerous to count cells/hpf  POCT urine pregnancy  Result Value Ref Range   Preg Test, Ur Negative Negative  POCT Wet + KOH Prep  Result Value Ref Range   Yeast by KOH Absent Present, Absent   Yeast by wet prep Absent Present, Absent   WBC by wet prep Too numerous to count  None, Few, Too numerous to count   Clue Cells Wet Prep HPF POC Few (A)  None, Too numerous to count   Trich by wet prep Absent Present, Absent   Bacteria Wet Prep HPF POC Many (A) None, Few, Too numerous to count   Epithelial Cells By [employer]et Pref ([facility]) Many (A) None, Few, Too numerous to count   RBC,UR,HPF,POC Few (A) None RBC/hpf      Assessment and Plan: Terry Gill is a 20 y.o. female who is here today for abdominal cramping.   Abdominal cramping - Plan: GC/Chlamydia Probe Amp, POCT urinalysis dipstick, POCT Microscopic Urinalysis ([facility]), POCT urine pregnancy, POCT Wet + KOH Prep  Vaginal discharge - Plan: POCT Wet + KOH Prep   Trena PlattStephanie Sharronda Schweers, PA-C [facility] 11/06/2015 3:11 PM

## 2015-11-07 ENCOUNTER — Other Ambulatory Visit: Payer: Self-pay | Admitting: Physician Assistant

## 2015-11-07 ENCOUNTER — Telehealth: Payer: Self-pay | Admitting: Physician Assistant

## 2015-11-07 DIAGNOSIS — R82998 Other abnormal findings in urine: Secondary | ICD-10-CM

## 2015-11-07 LAB — GC/CHLAMYDIA PROBE AMP
CT Probe RNA: NOT DETECTED
GC Probe RNA: NOT DETECTED

## 2015-11-07 NOTE — Telephone Encounter (Signed)
Please advise patient that a urine culture was not obtained.  i would like her to return for a labs only urine, and I will contact you with the results. Gonorrhea/chlamydia was negative.

## 2015-11-14 NOTE — Telephone Encounter (Signed)
Spoke with pt and she was informed, she will try to come in some time this week

## 2015-11-19 ENCOUNTER — Ambulatory Visit (INDEPENDENT_AMBULATORY_CARE_PROVIDER_SITE_OTHER): Payer: PRIVATE HEALTH INSURANCE | Admitting: Physician Assistant

## 2015-11-19 ENCOUNTER — Encounter: Payer: Self-pay | Admitting: *Deleted

## 2015-11-19 DIAGNOSIS — R82998 Other abnormal findings in urine: Secondary | ICD-10-CM

## 2015-11-19 DIAGNOSIS — N390 Urinary tract infection, site not specified: Secondary | ICD-10-CM

## 2015-11-21 LAB — URINE CULTURE
Colony Count: NO GROWTH
Organism ID, Bacteria: NO GROWTH

## 2015-11-29 NOTE — Progress Notes (Signed)
Labs only

## 2016-01-08 IMAGING — CR DG ANKLE COMPLETE 3+V*L*
4 series · 4 of 4 positions shown · non-contrast
Comparison: None.

CLINICAL DATA: Lateral left ankle pain and dorsal proximal foot
pain for 1 month. Initial encounter.

EXAM:
LEFT ANKLE COMPLETE - 3+ VIEW

[AP]
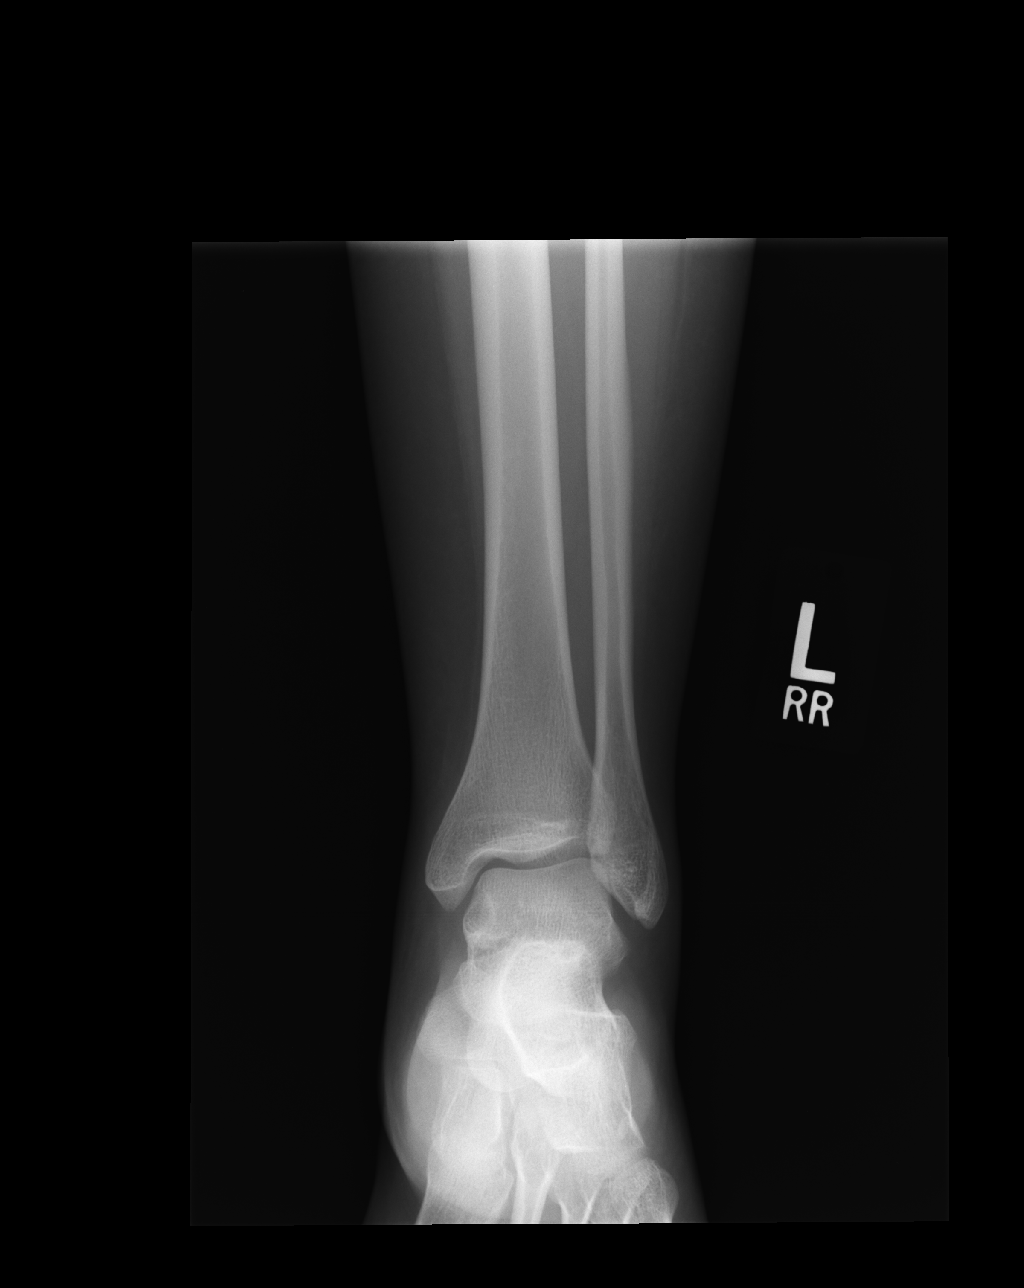

[ap obl int rot (1 of 2)]
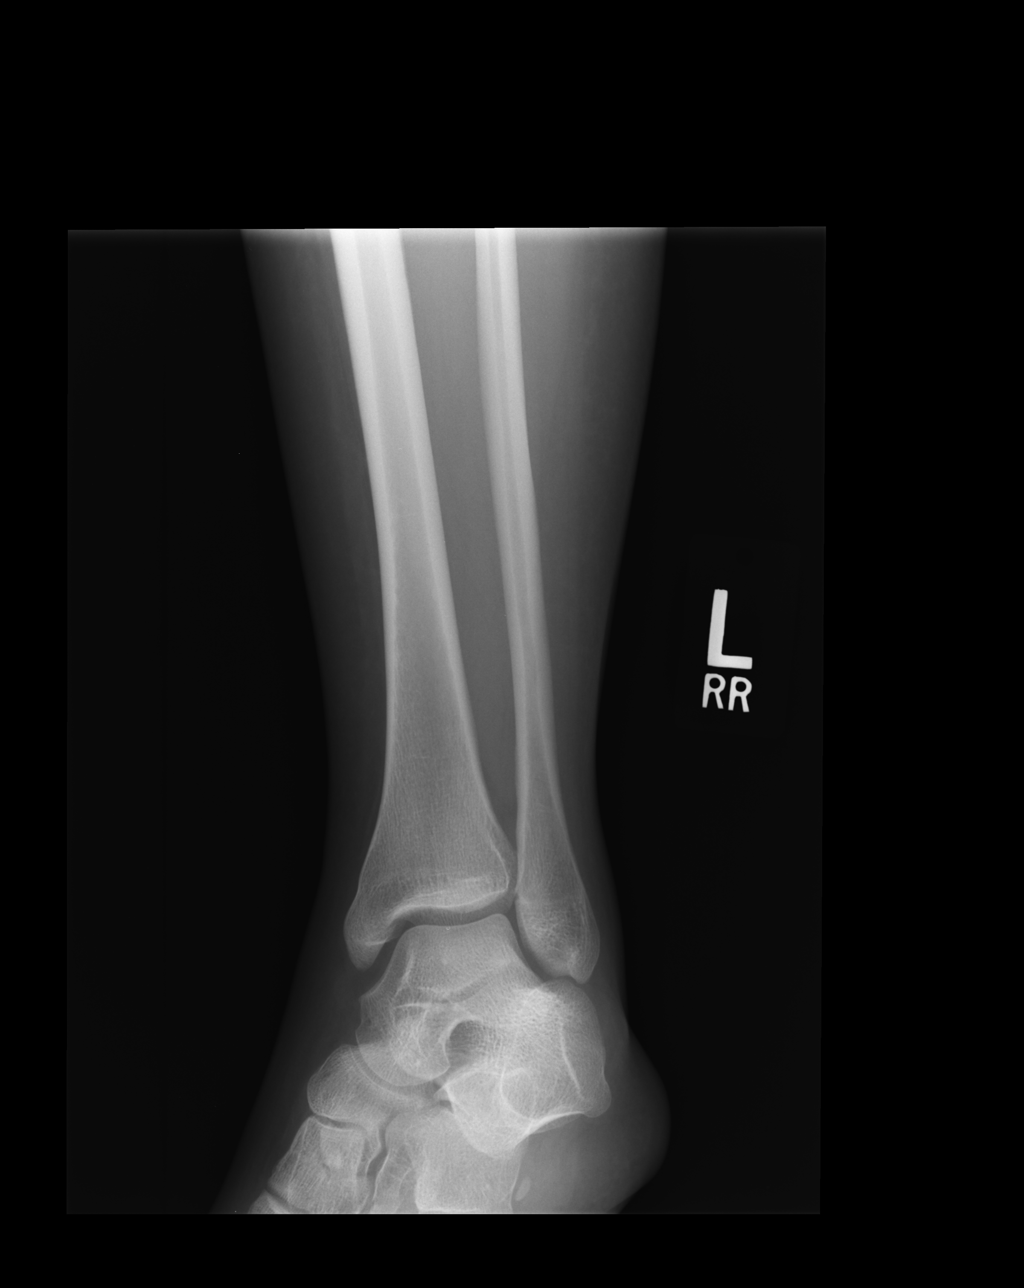

[ap obl int rot (2 of 2)]
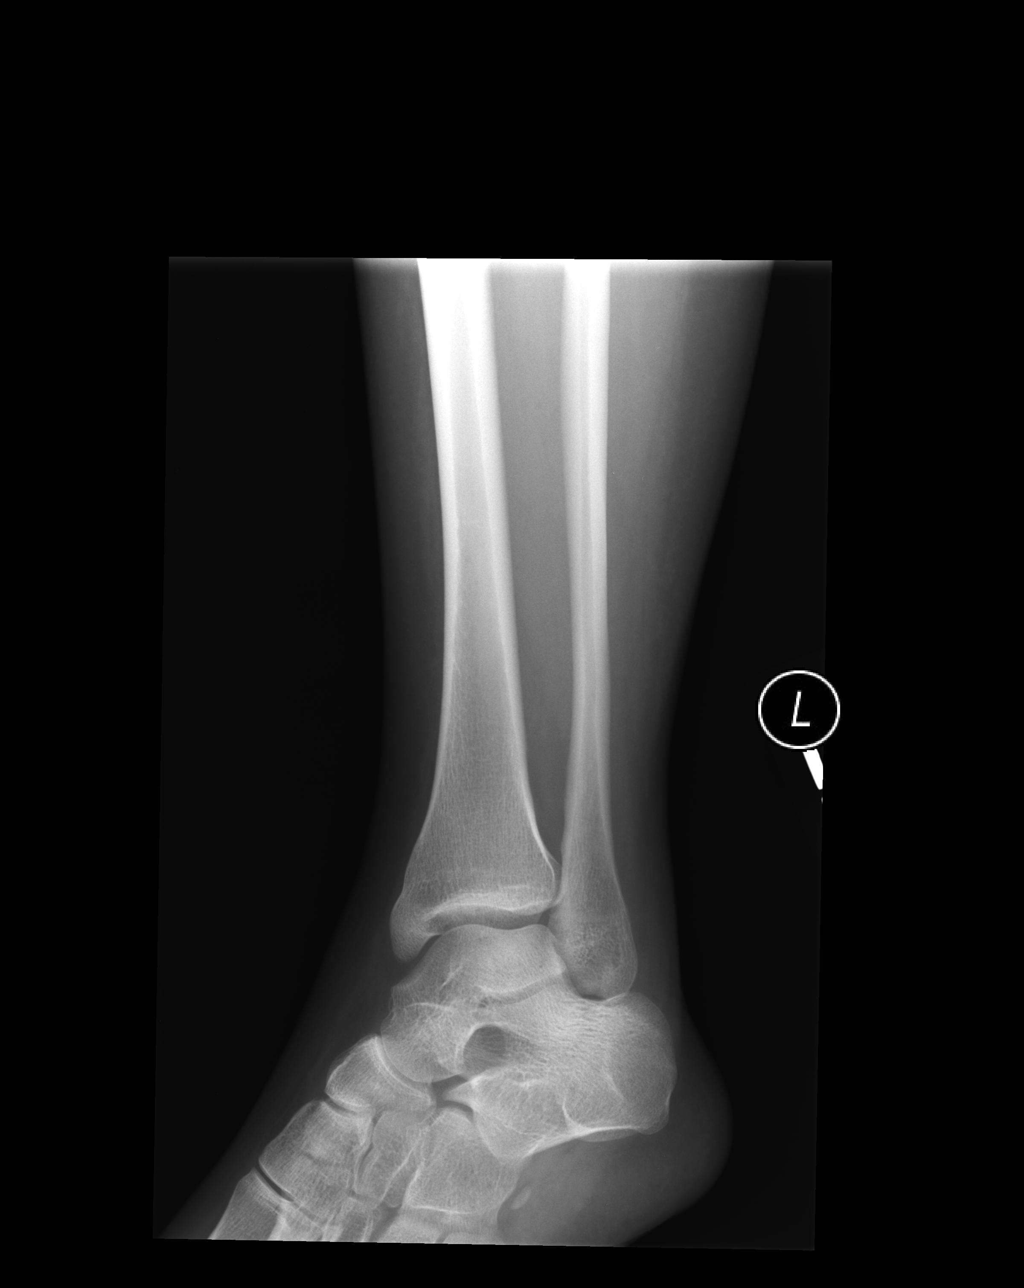

[lateral]
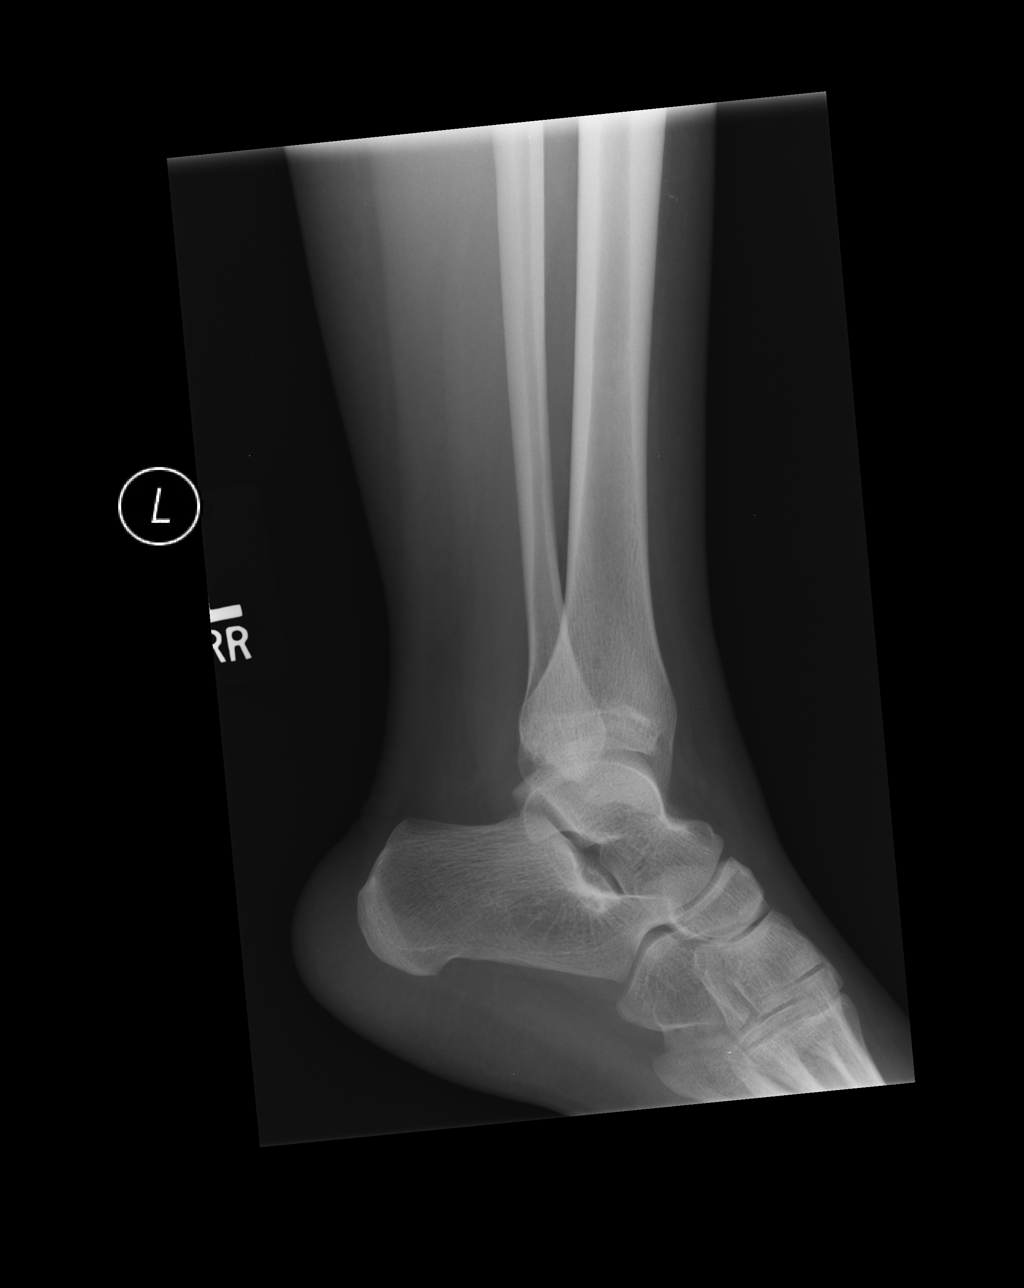

[4 of 4 positions shown; findings below may reference images not displayed]

FINDINGS: There is no evidence of fracture, dislocation, or joint effusion.
There is no evidence of arthropathy or other focal bone abnormality.
Soft tissues are unremarkable.
IMPRESSION: Negative.

## 2016-02-11 ENCOUNTER — Encounter: Payer: Self-pay | Admitting: Family Medicine

## 2016-02-11 ENCOUNTER — Ambulatory Visit (INDEPENDENT_AMBULATORY_CARE_PROVIDER_SITE_OTHER): Payer: Managed Care, Other (non HMO) | Admitting: Family Medicine

## 2016-02-11 VITALS — BP 110/80 | HR 60 | Temp 98.6°F | Resp 12 | Ht 61.0 in | Wt 167.2 lb

## 2016-02-11 DIAGNOSIS — I499 Cardiac arrhythmia, unspecified: Secondary | ICD-10-CM | POA: Diagnosis not present

## 2016-02-11 DIAGNOSIS — N946 Dysmenorrhea, unspecified: Secondary | ICD-10-CM | POA: Insufficient documentation

## 2016-02-11 DIAGNOSIS — R21 Rash and other nonspecific skin eruption: Secondary | ICD-10-CM | POA: Diagnosis not present

## 2016-02-11 DIAGNOSIS — Z8619 Personal history of other infectious and parasitic diseases: Secondary | ICD-10-CM | POA: Diagnosis not present

## 2016-02-11 MED ORDER — IBUPROFEN 800 MG PO TABS: 800.0000 mg | ORAL_TABLET | Freq: Three times a day (TID) | ORAL | 0 refills | Status: AC | PRN

## 2016-02-11 MED ORDER — TRIAMCINOLONE ACETONIDE 0.1 % EX CREA: 1.0000 "application " | TOPICAL_CREAM | Freq: Two times a day (BID) | CUTANEOUS | 1 refills | Status: DC

## 2016-02-11 MED ORDER — VALACYCLOVIR HCL 500 MG PO TABS: 500.0000 mg | ORAL_TABLET | Freq: Two times a day (BID) | ORAL | 1 refills | Status: AC

## 2016-02-11 NOTE — Progress Notes (Signed)
HPI:   [name] is a 20 y.o. female, who is here today to establish care with me.  Former PCP: N/A Last preventive routine visit: 1-2 years ago.  Concerns today: recent Dx genital herpes, would like education about this, refills for Valacyclovir. She has some skin lesions "poppng" on legs for the past 2-3 weeks and wonders if they are related to herpes infection.  Skin lesions look like insect bites, pruritic, no tender and no vesiculae, appear random and heal spontaneously. She ahs not used any OTC treatment. No prior Hx of similar lesions.  She states that she was also given Diflucan x 2 to treat "yeast" after "abx" treatment. She was evaluated a few weeks ago in acute care for URI symptoms, treated with Augmentin, did not complete treatment because symptoms resolved completely. Still having some pruritic areas: groin and external genital, denies abnormal vaginal discharge or bleeding.  Reporting STD screening in the ER  at the time of HSV Dx.  Menstrual cramps, requesting refills for Ibuprofen 800 mg to take as needed. States that pelvic pain with menses is severe and "always" has had this problem. Ibuprofen helps "but not for long." She has seen gyn in the past and placed on OCP's but she discontinued because did not help at all.  She has not been sexually active since 10/2015. Reports "multiple" pap smears , which she attributes to abortion she had in 2011, she does not report any complication during or after procedure, no Hx of abnormal pap smears.  Reporting pelvic/transvaginal U/S in the past x 2 while she was living in [place]; reported left ovarian cyst.  She lives with mother. She states that she exercises regularly, walks. In regard to her diet, she states that her diet is "fine."  During examination I noted mildly irregular HR. Denies chest pain, dyspnea, palpitation, claudication, focal weakness, or edema.    Review of Systems  Constitutional:  Negative for activity change, appetite change, fatigue, fever and unexpected weight change.  HENT: Negative for facial swelling, mouth sores, nosebleeds, sinus pressure, sore throat, trouble swallowing and voice change.   Respiratory: Negative for cough, shortness of breath and wheezing.   Cardiovascular: Negative for chest pain, palpitations and leg swelling.  Gastrointestinal: Negative for abdominal pain, nausea and vomiting.       Negative for changes in bowel habits.  Genitourinary: Positive for menstrual problem and pelvic pain. Negative for decreased urine volume, difficulty urinating, dysuria, genital sores, hematuria, vaginal bleeding, vaginal discharge and vaginal pain.  Musculoskeletal: Negative for arthralgias and myalgias.  Skin: Positive for rash. Negative for wound.  Neurological: Negative for seizures, syncope, weakness, numbness and headaches.  Psychiatric/Behavioral: Negative for confusion and sleep disturbance. The patient is nervous/anxious.       No current outpatient prescriptions on file prior to visit.   No current facility-administered medications on file prior to visit.      Past Medical History:  Diagnosis Date  . Dysmenorrhea   . Genital herpes    No Known Allergies  No family history on file.Non contributory.  Social History   Social History  . Marital status: Single    Spouse name: N/A  . Number of children: N/A  . Years of education: N/A   Social History Main Topics  . Smoking status: Never Smoker  . Smokeless tobacco: Never Used  . Alcohol use No     Comment: occ  . Drug use: No  . Sexual activity: Yes  Birth control/ protection: None   Other Topics Concern  . None   Social History Narrative  . None    Vitals:   02/11/16 1345  BP: 110/80  Pulse: 60  Resp: 12  Temp: 98.6 F (37 C)    Body mass index is 31.6 kg/m.    Physical Exam  Nursing note and vitals reviewed. Constitutional: She is oriented to person, place,  and time. She appears well-developed. No distress.  HENT:  Head: Atraumatic.  Mouth/Throat: Oropharynx is clear and moist and mucous membranes are normal.  Eyes: Conjunctivae and EOM are normal.  Cardiovascular: Normal rate.  An irregular rhythm present.  No murmur heard. Respiratory: Effort normal and breath sounds normal. No respiratory distress.  GI: Soft. She exhibits no mass. There is no hepatomegaly. There is no tenderness.  Musculoskeletal: She exhibits no edema or tenderness.  Lymphadenopathy:    She has no cervical adenopathy.  Neurological: She is alert and oriented to person, place, and time. She has normal strength. Coordination normal.  Skin: Skin is warm. Rash noted. No ecchymosis noted. Rash is not vesicular. No erythema.     Scarred papular non erythematouse lesions on LE, no vesicles, some surrounded by scaly skin, healing.No tender, no local heat.  Psychiatric: Her mood appears anxious. Her affect is blunt.  Well groomed, good eye contact.      ASSESSMENT AND PLAN:     Larkin was seen today for new patient (initial visit).  Diagnoses and all orders for this visit:  Dysmenorrhea  Reported as severe and Ibuprofen helping some but still not well controlled. We discussed some treatment options, she doesn't want to try OCP again. Some side effects of NSAIDs discussed, recommended continue ibuprofen for 2-3 days with each menstrual cycle and not more often that 3 tablets per day. Appointment with gynecologist will be arranged to discussed other treatment options.   -     Ambulatory referral to Gynecology -     ibuprofen (ADVIL,MOTRIN) 800 MG tablet; Take 1 tablet (800 mg total) by mouth every 8 (eight) hours as needed for cramping (2-3 days with menses).  History of herpes genitalis  Education about HSV infectious discussed as well as symptomatology and risk of recurrence. Prescription for Valtrex for prophylactic treatment was sent to the  pharmacy. Educated about STD prevention also provided.  -     valACYclovir (VALTREX) 500 MG tablet; Take 1 tablet (500 mg total) by mouth 2 (two) times daily. within 48 hours of acute onset.  Rash and nonspecific skin eruption  I don't think rash on lower extremities is related to HSV infection. They are already healing, ? Insect bites. Topical steroid, small amount twice daily for up to 2 weeks was recommended to help with pruritus. Follow-up as needed.  -     triamcinolone cream (KENALOG) 0.1 %; Apply 1 application topically 2 (two) times daily.  Irregular heart rate  Possible causes discussed, reassured. EKG today sinus arrhythmia, rest negative. No further workup recommended at this time. Instructed about warning signs.  -     EKG 12-Lead        Sherald Balbuena G. [place], MD  [facility] [place] office.

## 2016-02-11 NOTE — Progress Notes (Signed)
Pre visit review using our clinic review tool, if applicable. No additional management support is needed unless otherwise documented below in the visit note. 

## 2016-02-11 NOTE — Patient Instructions (Addendum)
A few things to remember from today's visit:   Irregular heart rate - Plan: EKG 12-Lead  History of herpes genitalis  Rash and nonspecific skin eruption - Plan: triamcinolone cream (KENALOG) 0.1 %  Dysmenorrhea - Plan: Ambulatory referral to Gynecology, ibuprofen (ADVIL,MOTRIN) 800 MG tablet  Continue healthy diet and regular exercise. Somebody will contact you for an appointment with gynecologist, who will continue following on your cramps with menstrual periods.  EKG today otherwise normal.  Please be sure medication list is accurate. If a new problem present, please set up appointment sooner than planned today.

## 2016-02-14 ENCOUNTER — Encounter: Payer: Self-pay | Admitting: Family Medicine

## 2016-02-25 ENCOUNTER — Encounter: Payer: Self-pay | Admitting: Obstetrics and Gynecology

## 2016-02-25 ENCOUNTER — Ambulatory Visit: Payer: PRIVATE HEALTH INSURANCE | Admitting: Obstetrics and Gynecology

## 2016-02-25 VITALS — BP 110/70 | HR 64 | Resp 14 | Ht 60.0 in | Wt 168.0 lb

## 2016-02-25 DIAGNOSIS — A6000 Herpesviral infection of urogenital system, unspecified: Secondary | ICD-10-CM

## 2016-02-25 DIAGNOSIS — Z01419 Encounter for gynecological examination (general) (routine) without abnormal findings: Secondary | ICD-10-CM | POA: Diagnosis not present

## 2016-02-25 DIAGNOSIS — Z124 Encounter for screening for malignant neoplasm of cervix: Secondary | ICD-10-CM

## 2016-02-25 DIAGNOSIS — Z8742 Personal history of other diseases of the female genital tract: Secondary | ICD-10-CM | POA: Diagnosis not present

## 2016-02-25 DIAGNOSIS — N946 Dysmenorrhea, unspecified: Secondary | ICD-10-CM | POA: Diagnosis not present

## 2016-02-25 DIAGNOSIS — Z23 Encounter for immunization: Secondary | ICD-10-CM | POA: Diagnosis not present

## 2016-02-25 MED ORDER — VALACYCLOVIR HCL 500 MG PO TABS: ORAL_TABLET | ORAL | 1 refills | Status: AC

## 2016-02-25 NOTE — Patient Instructions (Signed)
EXERCISE AND DIET:  We recommended that you start or continue a regular exercise program for good health. Regular exercise means any activity that makes your heart beat faster and makes you sweat.  We recommend exercising at least 30 minutes per day at least 3 days a week, preferably 4 or 5.  We also recommend a diet low in fat and sugar.  Inactivity, poor dietary choices and obesity can cause diabetes, heart attack, stroke, and kidney damage, among others.    ALCOHOL AND SMOKING:  Women should limit their alcohol intake to no more than 7 drinks/beers/glasses of wine (combined, not each!) per week. Moderation of alcohol intake to this level decreases your risk of breast cancer and liver damage. And of course, no recreational drugs are part of a healthy lifestyle.  And absolutely no smoking or even second hand smoke. Most people know smoking can cause heart and lung diseases, but did you know it also contributes to weakening of your bones? Aging of your skin?  Yellowing of your teeth and nails?  CALCIUM AND VITAMIN D:  Adequate intake of calcium and Vitamin D are recommended.  The recommendations for exact amounts of these supplements seem to change often, but generally speaking 600 mg of calcium (either carbonate or citrate) and 800 units of Vitamin D per day seems prudent. Certain women may benefit from higher intake of Vitamin D.  If you are among these women, your doctor will have told you during your visit.    PAP SMEARS:  Pap smears, to check for cervical cancer or precancers,  have traditionally been done yearly, although recent scientific advances have shown that most women can have pap smears less often.  However, every woman still should have a physical exam from her gynecologist every year. It will include a breast check, inspection of the vulva and vagina to check for abnormal growths or skin changes, a visual exam of the cervix, and then an exam to evaluate the size and shape of the uterus and  ovaries.  And after 22 years of age, a rectal exam is indicated to check for rectal cancers. We will also provide age appropriate advice regarding health maintenance, like when you should have certain vaccines, screening for sexually transmitted diseases, bone density testing, colonoscopy, mammograms, etc.   MAMMOGRAMS:  All women over 40 years old should have a yearly mammogram. Many facilities now offer a "3D" mammogram, which may cost around $50 extra out of pocket. If possible,  we recommend you accept the option to have the 3D mammogram performed.  It both reduces the number of women who will be called back for extra views which then turn out to be normal, and it is better than the routine mammogram at detecting truly abnormal areas.    COLONOSCOPY:  Colonoscopy to screen for colon cancer is recommended for all women at age 50.  We know, you hate the idea of the prep.  We agree, BUT, having colon cancer and not knowing it is worse!!  Colon cancer so often starts as a polyp that can be seen and removed at colonscopy, which can quite literally save your life!  And if your first colonoscopy is normal and you have no family history of colon cancer, most women don't have to have it again for 10 years.  Once every ten years, you can do something that may end up saving your life, right?  We will be happy to help you get it scheduled when you are ready.    Be sure to check your insurance coverage so you understand how much it will cost.  It may be covered as a preventative service at no cost, but you should check your particular policy.      Endometriosis Endometriosis is a condition in which the tissue that lines the uterus (endometrium) grows outside of its normal location. The tissue may grow in many locations close to the uterus, but it commonly grows on the ovaries, fallopian tubes, vagina, or bowel. Because the uterus expels, or sheds, its lining every menstrual cycle, there is bleeding wherever the  endometrial tissue is located. This can cause pain because blood is irritating to tissues not normally exposed to it.  CAUSES  The cause of endometriosis is not known.  SIGNS AND SYMPTOMS  Often, there are no symptoms. When symptoms are present, they can vary with the location of the displaced tissue. Various symptoms can occur at different times. Although symptoms occur mainly during a woman's menstrual period, they can also occur midcycle and usually stop with menopause. Some people may go months with no symptoms at all. Symptoms may include:   Back or abdominal pain.   Heavier bleeding during periods.   Pain during intercourse.   Painful bowel movements.   Infertility. DIAGNOSIS  Your health care provider will do a physical exam and ask about your symptoms. Various tests may be done, such as:   Blood tests and urine tests. These are done to help rule out other problems.   Ultrasound. This test is done to look for abnormal tissue.   An X-ray of the lower bowel (barium enema).  Laparoscopy. In this procedure, a thin, lighted tube with a tiny camera on the end (laparoscope) is inserted into your abdomen. This helps your health care provider look for abnormal tissue to confirm the diagnosis. The health care provider may also remove a small piece of tissue (biopsy) from any abnormal tissue found. This tissue sample can then be sent to a lab so it can be looked at under a microscope. TREATMENT  Treatment will vary and may include:   Medicines to relieve pain. Nonsteroidal anti-inflammatory drugs (NSAIDs) are a type of pain medicine that can help to relieve the pain caused by endometriosis.  Hormonal therapy. When using hormonal therapy, periods are eliminated. This eliminates the monthly exposure to blood by the displaced endometrial tissue.   Surgery. Surgery may sometimes be done to remove the abnormal endometrial tissue. In severe cases, surgery may be done to remove the  fallopian tubes, uterus, and ovaries (hysterectomy). HOME CARE INSTRUCTIONS   Take all medicines as directed by your health care provider. Do not take aspirin because it may increase bleeding when you are not on hormonal therapy.   Avoid activities that produce pain, including sexual activity. SEEK MEDICAL CARE IF:  You have pelvic pain before, after, or during your periods.  You have pelvic pain between periods that gets worse during your period.  You have pelvic pain during or after sex.  You have pelvic pain with bowel movements or urination, especially during your period.  You have problems getting pregnant.  You have a fever. SEEK IMMEDIATE MEDICAL CARE IF:   Your pain is severe and is not responding to pain medicine.   You have severe nausea and vomiting, or you cannot keep foods down.   You have pain that is limited to the right lower part of your abdomen.   You have swelling or increasing pain in your abdomen.  You see blood in your stool.  MAKE SURE YOU:   Understand these instructions.  Will watch your condition.  Will get help right away if you are not doing well or get worse.   This information is not intended to replace advice given to you by your health care provider. Make sure you discuss any questions you have with your health care provider.   Document Released: 04/25/2000 Document Revised: 05/19/2014 Document Reviewed: 12/24/2012 Elsevier Interactive Patient Education 2016 [employer] Inc[employer]  Breast Self-Awareness Practicing breast self-awareness may pick up problems early, prevent significant medical complications, and possibly save your life. By practicing breast self-awareness, you can become familiar with how your breasts look and feel and if your breasts are changing. This allows you to notice changes early. It can also offer you some reassurance that your breast health is good. One way to learn what is normal for your breasts and whether your  breasts are changing is to do a breast self-exam. If you find a lump or something that was not present in the past, it is best to contact your caregiver right away. Other findings that should be evaluated by your caregiver include nipple discharge, especially if it is bloody; skin changes or reddening; areas where the skin seems to be pulled in (retracted); or new lumps and bumps. Breast pain is seldom associated with cancer (malignancy), but should also be evaluated by a caregiver. HOW TO PERFORM A BREAST SELF-EXAM The best time to examine your breasts is 5-7 days after your menstrual period is over. During menstruation, the breasts are lumpier, and it may be more difficult to pick up changes. If you do not menstruate, have reached menopause, or had your uterus removed (hysterectomy), you should examine your breasts at regular intervals, such as monthly. If you are breastfeeding, examine your breasts after a feeding or after using a breast pump. Breast implants do not decrease the risk for lumps or tumors, so continue to perform breast self-exams as recommended. Talk to your caregiver about how to determine the difference between the implant and breast tissue. Also, talk about the amount of pressure you should use during the exam. Over time, you will become more familiar with the variations of your breasts and more comfortable with the exam. A breast self-exam requires you to remove all your clothes above the waist. 1. Look at your breasts and nipples. Stand in front of a mirror in a room with good lighting. With your hands on your hips, push your hands firmly downward. Look for a difference in shape, contour, and size from one breast to the other (asymmetry). Asymmetry includes puckers, dips, or bumps. Also, look for skin changes, such as reddened or scaly areas on the breasts. Look for nipple changes, such as discharge, dimpling, repositioning, or redness. 2. Carefully feel your breasts. This is best done  either in the shower or tub while using soapy water or when flat on your back. Place the arm (on the side of the breast you are examining) above your head. Use the pads (not the fingertips) of your three middle fingers on your opposite hand to feel your breasts. Start in the underarm area and use  inch (2 cm) overlapping circles to feel your breast. Use 3 different levels of pressure (light, medium, and firm pressure) at each circle before moving to the next circle. The light pressure is needed to feel the tissue closest to the skin. The medium pressure will help to feel breast tissue a little  deeper, while the firm pressure is needed to feel the tissue close to the ribs. Continue the overlapping circles, moving downward over the breast until you feel your ribs below your breast. Then, move one finger-width towards the center of the body. Continue to use the  inch (2 cm) overlapping circles to feel your breast as you move slowly up toward the collar bone (clavicle) near the base of the neck. Continue the up and down exam using all 3 pressures until you reach the middle of the chest. Do this with each breast, carefully feeling for lumps or changes. 3.  Keep a written record with breast changes or normal findings for each breast. By writing this information down, you do not need to depend only on memory for size, tenderness, or location. Write down where you are in your menstrual cycle, if you are still menstruating. Breast tissue can have some lumps or thick tissue. However, see your caregiver if you find anything that concerns you.  SEEK MEDICAL CARE IF:  You see a change in shape, contour, or size of your breasts or nipples.   You see skin changes, such as reddened or scaly areas on the breasts or nipples.   You have an unusual discharge from your nipples.   You feel a new lump or unusually thick areas.    This information is not intended to replace advice given to you by your health care  provider. Make sure you discuss any questions you have with your health care provider.   Document Released: 04/28/2005 Document Revised: 04/14/2012 Document Reviewed: 08/13/2011 Elsevier Interactive Patient Education [employer]

## 2016-02-25 NOTE — Addendum Note (Signed)
Addended by: Shelda JakesHANNER, Sandi Towe E on: 02/25/2016 02:50 PM   Modules accepted: Orders

## 2016-02-25 NOTE — Progress Notes (Signed)
20 y.o. G1P0010 SingleAfrican AmericanF here c/o dysmenorrhea. Due for an annual exam.  Menarche around 11-12, menses q month. She started having bad cramps when she started high school. They have gotten worse. She is missing school and work because of her cramps. She tried OCP's for a year, cramps didn't improve. She was on ibuprofen 800 mg, doesn't really help. Cramps are bad for 3-4 days.  She had an ultrasound about 4 months ago, ovarian cysts. 2 cysts on the left ovary (2.6 cm and 2.9 cm). Was told she needed a f/u ultrasound.  She is sexually active, but not in the last few month. Occasional pain with intercourse. She has a h/o type 1 genital herpes. Only one outbreak so far (2 months ago).  Period Cycle (Days): 28 Period Duration (Days): 5 days  Period Pattern: Regular Menstrual Flow: Moderate, Heavy Menstrual Control: Hospital pad Menstrual Control Change Freq (Hours): changes pad 4 times a day on heavy days  Dysmenorrhea: (!) Severe Dysmenorrhea Symptoms: Cramping, Headache, Other (Comment)  Patient's last menstrual period was 02/19/2016.          Sexually active: Yes.    The current method of family planning is none.    Exercising: Yes.    walking Smoker:  no  Health Maintenance: Pap: Never History of abnormal Pap: N/A  TDaP:  Unsure, will check with her Mom Gardasil: no   reports that she has never smoked. She has never used smokeless tobacco. She reports that she drinks alcohol. She reports that she does not use drugs.Rare ETOH. Full time student at [employer], graduates in 12/17. Majoring in [place] and media studies. Wants to go to grad school, but also wants a job.   Past Medical History:  Diagnosis Date  . Dysmenorrhea   . Genital herpes   . Ovarian cyst     Past Surgical History:  Procedure Laterality Date  . DILATION AND CURETTAGE OF UTERUS    EAB, 3.5 months, no complications.   Current Outpatient Prescriptions  Medication Sig Dispense Refill  .  ibuprofen (ADVIL,MOTRIN) 800 MG tablet Take 1 tablet (800 mg total) by mouth every 8 (eight) hours as needed for cramping (2-3 days with menses). 30 tablet 0   No current facility-administered medications for this visit.     Family History  Problem Relation Age of Onset  . Hypertension Mother   . Lung cancer Maternal Aunt   . Diabetes Maternal Grandmother   . Diabetes Maternal Grandfather   . Diabetes Paternal Grandmother   . Diabetes Paternal Grandfather     Review of Systems  Constitutional: Negative.   HENT: Negative.   Eyes: Negative.   Respiratory: Negative.   Cardiovascular: Negative.   Gastrointestinal: Negative.   Endocrine: Negative.   Genitourinary: Positive for menstrual problem.       Dysmenorrhea   Musculoskeletal: Negative.   Skin: Negative.   Allergic/Immunologic: Negative.   Neurological: Negative.   Psychiatric/Behavioral: Negative.     Exam:   BP 110/70 (BP Location: Left Arm, Patient Position: Sitting, Cuff Size: Normal)   Pulse 64   Resp 14   Ht 5' (1.524 m)   Wt 168 lb (76.2 kg)   LMP 02/19/2016   BMI 32.81 kg/m   Weight change: @WEIGHTCHANGE @ Height:   Height: 5' (152.4 cm)  Ht Readings from Last 3 Encounters:  02/25/16 5' (1.524 m)  02/11/16 5\' 1"  (1.549 m)  11/06/15 5\' 1"  (1.549 m)    General appearance: alert, cooperative and appears stated  age Head: Normocephalic, without obvious abnormality, atraumatic Neck: no adenopathy, supple, symmetrical, trachea midline and thyroid normal to inspection and palpation Lungs: clear to auscultation bilaterally Breasts: normal appearance, no masses or tenderness Heart: regular rate and rhythm Abdomen: soft, non-tender; bowel sounds normal; no masses,  no organomegaly Extremities: extremities normal, atraumatic, no cyanosis or edema Skin: Skin color, texture, turgor normal. No rashes or lesions Lymph nodes: Cervical, supraclavicular, and axillary nodes normal. No abnormal inguinal nodes  palpated Neurologic: Grossly normal   Pelvic: External genitalia:  no lesions              Urethra:  normal appearing urethra with no masses, tenderness or lesions              Bartholins and Skenes: normal                 Vagina: normal appearing vagina with normal color and discharge, no lesions              Cervix: no lesions               Bimanual Exam:  Uterus:  normal size, contour, position, consistency, mobility, non-tender and anteverted              Adnexa: no mass, fullness, tenderness               Rectovaginal: Confirms               Anus:  normal sphincter tone, no lesions  Chaperone was present for exam.  A:  Well Woman with normal exam  H/O Genital HSV  Severe dysmenorrhea  H/O ovarian cysts  P:   Negative STD testing 2 months ago when she was diagnosed with HSV, not sexually active since then  Pap   Return for a GYN ultrasound  Discussed the option of depo-provera, the mirena IUD, laparoscopy  Valtrex for prn use  Discussed the gardasil, information given  OCP's didn't help

## 2016-02-27 LAB — IPS PAP SMEAR ONLY

## 2016-03-04 ENCOUNTER — Ambulatory Visit (INDEPENDENT_AMBULATORY_CARE_PROVIDER_SITE_OTHER): Payer: Managed Care, Other (non HMO)

## 2016-03-04 ENCOUNTER — Encounter: Payer: Self-pay | Admitting: Obstetrics and Gynecology

## 2016-03-04 ENCOUNTER — Ambulatory Visit: Payer: Managed Care, Other (non HMO) | Admitting: Obstetrics and Gynecology

## 2016-03-04 VITALS — BP 118/80 | HR 80 | Resp 13 | Wt 168.0 lb

## 2016-03-04 DIAGNOSIS — N946 Dysmenorrhea, unspecified: Secondary | ICD-10-CM | POA: Diagnosis not present

## 2016-03-04 DIAGNOSIS — Z8742 Personal history of other diseases of the female genital tract: Secondary | ICD-10-CM | POA: Diagnosis not present

## 2016-03-04 NOTE — Patient Instructions (Signed)

## 2016-03-04 NOTE — Progress Notes (Signed)
GYNECOLOGY  VISIT   HPI: 20 y.o.   Single  African American  female   G1P0010 with Patient's last menstrual period was 02/19/2016.   here for follow up dysmenorrhea and a h/o ovarian cysts. Her dysmenorrhea didn't improve with OCP's.      GYNECOLOGIC HISTORY: Patient's last menstrual period was 02/19/2016. Contraception:none  Menopausal hormone therapy: none        OB History    Gravida Para Term Preterm AB Living   1       1     SAB TAB Ectopic Multiple Live Births                     Patient Active Problem List   Diagnosis Date Noted  . History of herpes genitalis 02/11/2016  . Rash and nonspecific skin eruption 02/11/2016  . Dysmenorrhea 02/11/2016    Past Medical History:  Diagnosis Date  . Dysmenorrhea   . Genital herpes   . Ovarian cyst     Past Surgical History:  Procedure Laterality Date  . DILATION AND CURETTAGE OF UTERUS      Current Outpatient Prescriptions  Medication Sig Dispense Refill  . ibuprofen (ADVIL,MOTRIN) 800 MG tablet Take 1 tablet (800 mg total) by mouth every 8 (eight) hours as needed for cramping (2-3 days with menses). 30 tablet 0  . valACYclovir (VALTREX) 500 MG tablet 1 tab po BID x 3 days as needed 30 tablet 1   No current facility-administered medications for this visit.      ALLERGIES: Review of patient's allergies indicates no known allergies.  Family History  Problem Relation Age of Onset  . Hypertension Mother   . Lung cancer Maternal Aunt   . Diabetes Maternal Grandmother   . Diabetes Maternal Grandfather   . Diabetes Paternal Grandmother   . Diabetes Paternal Grandfather     Social History   Social History  . Marital status: Single    Spouse name: N/A  . Number of children: N/A  . Years of education: N/A   Occupational History  . Not on file.   Social History Main Topics  . Smoking status: Never Smoker  . Smokeless tobacco: Never Used  . Alcohol use Yes     Comment: occ  . Drug use: No  . Sexual activity:  Yes    Partners: Male    Birth control/ protection: None   Other Topics Concern  . Not on file   Social History Narrative  . No narrative on file    Review of Systems  Constitutional: Negative.   HENT: Negative.   Eyes: Negative.   Respiratory: Negative.   Cardiovascular: Negative.   Gastrointestinal: Negative.   Genitourinary: Negative.   Musculoskeletal: Negative.   Skin: Negative.   Neurological: Negative.   Endo/Heme/Allergies: Negative.   Psychiatric/Behavioral: Negative.     PHYSICAL EXAMINATION:    BP 118/80 (BP Location: Left Arm, Patient Position: Sitting, Cuff Size: Normal)   Pulse 80   Resp 13   Wt 168 lb (76.2 kg)   LMP 02/19/2016   BMI 32.81 kg/m     General appearance: alert, cooperative and appears stated age  Ultrasound images reviewed  ASSESSMENT Severe dysmenorrhea, takes ibuprofen (helps a little), no help with cyclic OCP's H/O ovarian cysts, resolved    PLAN Discussed options of trying the 3 month pill (no contraindications), depo-provera, the mirena IUD and laparoscopy Mirena handout given, risks and side effects reviewed (would pre-treat with cytotec) She will call  and let us know if she wants to do any of the above Risks for OCP's discussed and information given After discussion, she will call with her cycle for mirena IUD insertion    An After Visit Summary was printed and given to the patient.  25 minutes face to face time of which over 50% was spent in counseling.

## 2016-03-13 ENCOUNTER — Ambulatory Visit (INDEPENDENT_AMBULATORY_CARE_PROVIDER_SITE_OTHER): Payer: Managed Care, Other (non HMO) | Admitting: Family Medicine

## 2016-03-13 ENCOUNTER — Encounter: Payer: Self-pay | Admitting: Family Medicine

## 2016-03-13 VITALS — BP 122/90 | HR 96 | Temp 98.4°F | Resp 12 | Ht 60.0 in | Wt 169.0 lb

## 2016-03-13 DIAGNOSIS — L03116 Cellulitis of left lower limb: Secondary | ICD-10-CM | POA: Diagnosis not present

## 2016-03-13 DIAGNOSIS — R200 Anesthesia of skin: Secondary | ICD-10-CM

## 2016-03-13 MED ORDER — CEPHALEXIN 500 MG PO CAPS: 500.0000 mg | ORAL_CAPSULE | Freq: Two times a day (BID) | ORAL | 0 refills | Status: AC

## 2016-03-13 NOTE — Progress Notes (Signed)
HPI:  ACUTE VISIT:  Chief Complaint  Patient presents with  . Insect Bite    [name] is a 20 y.o. female, who is here today complaining of pruritic and tender skin lesion on left lower extremity, which she noted yesterday and thinks it is caused by insect bite, she is concerned about spider bite.  According to pt, yesterday morning when she got up and walking to her kitchen she had mild tingling and numbness sensation on anterior aspect of left thigh, no other symptom and lasted about 1-2 minutes. Later she noted a skin lesion on posterior aspect of same thigh, pruritic and tender, "hurts really bad", constant, exacerbated by touch and alleviated some by Cortisone cream. Pain is not radiated and it does not cause limitation of movement or activities.  She thinks condition was caused by a spider bite on halloween night, she does not recall feeling any sting or pain on area, and she did not sit on outdoor areas/park sits/stairs. She states that she had a couple drinks, "little intoxicated", so she does not think she would have felt a bite if in fact she was bitten by an insect that night.  No known contact with plants, new medications, or new skin product.  She denies associated fever,chills, easy bruising, arthralgias/back pain, focal weakness, or unusual headache. She denies odynophagia, dysphagia, oral/facial edema or lesions, dyspnea, cough, wheezing, or chest pain.  Denies abdominal pain, nausea, vomiting, changes in bowel habits.   She has tried OTC Cortisone and Neosporin, alcohol pads.   Review of Systems  Constitutional: Negative for activity change, appetite change, chills, fatigue and fever.  HENT: Negative for congestion, mouth sores, nosebleeds, sore throat, trouble swallowing and voice change.   Eyes: Negative for pain, redness and visual disturbance.  Respiratory: Negative for cough, chest tightness, shortness of breath and wheezing.   Cardiovascular:  Negative for chest pain, palpitations and leg swelling.  Gastrointestinal: Negative for abdominal pain, diarrhea, nausea and vomiting.  Genitourinary: Negative for decreased urine volume and hematuria.  Musculoskeletal: Negative for arthralgias, back pain, joint swelling and myalgias.  Skin: Positive for rash.  Allergic/Immunologic: Negative for environmental allergies.  Neurological: Positive for numbness. Negative for dizziness, syncope, facial asymmetry, weakness and headaches.  Hematological: Negative for adenopathy. Does not bruise/bleed easily.  Psychiatric/Behavioral: Negative for confusion. The patient is nervous/anxious.       Current Outpatient Prescriptions on File Prior to Visit  Medication Sig Dispense Refill  . ibuprofen (ADVIL,MOTRIN) 800 MG tablet Take 1 tablet (800 mg total) by mouth every 8 (eight) hours as needed for cramping (2-3 days with menses). 30 tablet 0  . valACYclovir (VALTREX) 500 MG tablet 1 tab po BID x 3 days as needed 30 tablet 1   No current facility-administered medications on file prior to visit.      Past Medical History:  Diagnosis Date  . Dysmenorrhea   . Genital herpes   . Ovarian cyst    No Known Allergies  Social History   Social History  . Marital status: Single    Spouse name: N/A  . Number of children: N/A  . Years of education: N/A   Social History Main Topics  . Smoking status: Never Smoker  . Smokeless tobacco: Never Used  . Alcohol use Yes     Comment: occ  . Drug use: No  . Sexual activity: Yes    Partners: Male    Birth control/ protection: None   Other Topics Concern  .  None   Social History Narrative  . None    Vitals:   03/13/16 1113  BP: 122/90  Pulse: 96  Resp: 12  Temp: 98.4 F (36.9 C)     Body mass index is 33.01 kg/m.    Physical Exam  Nursing note and vitals reviewed. Constitutional: She is oriented to person, place, and time. She appears well-developed. No distress.  HENT:  Head:  Atraumatic.  Mouth/Throat: Uvula is midline, oropharynx is clear and moist and mucous membranes are normal.  Eyes: Conjunctivae and EOM are normal. No scleral icterus.  Cardiovascular: Normal rate and regular rhythm.   No murmur heard. Respiratory: Effort normal and breath sounds normal. No stridor. No respiratory distress.  Musculoskeletal: She exhibits no edema.  Lymphadenopathy:    She has no cervical adenopathy.  Neurological: She is alert and oriented to person, place, and time.  Skin: Skin is warm. Rash noted. No ecchymosis and no petechiae noted. There is erythema. No cyanosis.     Posterior aspect of left thigh with erythematous, raised lesion, central induration, local heat, defined borders, mildly tender and no fluctuant areas or drainage. Lesion is about 5-6 cm.  Psychiatric: Her speech is normal. Her mood appears anxious.  Well groomed, good eye contact.      ASSESSMENT AND PLAN:     Terry Gill was seen today for insect bite.  Diagnoses and all orders for this visit:  Cellulitis of leg, left  Explained it is hard to determine specific etiology, certainly it can be a local reaction due to insect bite. It doesn't seem to be spider bite. I also explained that she may not even need abx treatment but because reported pain severity and not clear etiology + findings on exam I prefer to cover with abx. Monitor for changes, new associated symptoms. Continue topical cortisone twice per day for up to 2 weeks. Instructed about warning signs. Follow-up as needed.  -     cephALEXin (KEFLEX) 500 MG capsule; Take 1 capsule (500 mg total) by mouth 2 (two) times daily.  Numbness of left anterior thigh  Resolved. Reassured for now, Hx does not seem concerning for a serious process. Explained that it may not be related to skin lesion but rather to alcohol ingestion the night before. Instructed about warning signs. F/U if this re-occur.      Return if symptoms worsen or fail  to improve.     -Ms.Terry Terry Gill advised to return or notify a doctor immediately if symptoms worsen or persist or new concerns arise.       Betty G. SwazilandJordan, MD  [facility]. [place] office.

## 2016-03-13 NOTE — Patient Instructions (Addendum)
A few things to remember from today's visit:   Cellulitis of leg, left  Could be a reaction to insect bite. Does not seem a spider bite. Itching can last a few weeks.  Continue topical cortisone. Over-the-counter Zyrtec 10 mg daily for 2-3 weeks.  Monitor for systemic symptoms, worsening rash, fever or other warning signs.   Please be sure medication list is accurate. If a new problem present, please set up appointment sooner than planned today.

## 2016-04-02 ENCOUNTER — Telehealth: Payer: Self-pay | Admitting: Family Medicine

## 2016-04-02 NOTE — Telephone Encounter (Signed)
See below. Patient is going to try and find an urgent care in the area she is in.

## 2016-04-02 NOTE — Telephone Encounter (Signed)
Patient Name: Terry Gill DOB: Apr 27, 1996 Initial Comment Caller states she is out of town. She has had cough, runny nose, and not being able to breath. Nurse Assessment Nurse: Charna Elizabethrumbull, RN, Cathy Date/Time (Eastern Time): 04/02/2016 8:23:06 AM Confirm and document reason for call. If symptomatic, describe symptoms. You must click the next button to save text entered. ---Caller states she is not having severe breathing difficulty. She developed a cough and cold symptoms about a week ago. No fever. No wheezing. No chest pain. Alert and responsive. Does the patient have any new or worsening symptoms? ---Yes Will a triage be completed? ---Yes Related visit to physician within the last 2 weeks? ---No Does the PT have any chronic conditions? (i.e. diabetes, asthma, etc.) ---No Is the patient pregnant or possibly pregnant? (Ask all females between the ages of 4812-55) ---No Is this a behavioral health or substance abuse call? ---No Guidelines Guideline Title Affirmed Question Affirmed Notes Cough - Acute Productive SEVERE coughing spells (e.g., whooping sound after coughing, vomiting after coughing) Final Disposition User See Physician within 24 Hours Trumbull, RN, Lynden AngCathy Comments Caller is out of town and will check on an urgent care facility in her area. Referrals GO TO FACILITY OTHER - SPECIFY Disagree/Comply: Comply

## 2016-04-10 ENCOUNTER — Telehealth: Payer: Self-pay | Admitting: Obstetrics and Gynecology

## 2016-04-10 NOTE — Telephone Encounter (Signed)
Patient returning your call.

## 2016-04-10 NOTE — Telephone Encounter (Signed)
Note forwarded to Dr. Oscar LaJertson, patient's primary GYN for review.  Cc- Dr. Oscar LaJertson

## 2016-04-10 NOTE — Telephone Encounter (Signed)
Patient says she had sex last night and woke up this morning and notice a little blood when she whipped this morning after using the bathroom.  Wants to know if this is normal and would like to speak to a nurse about it.

## 2016-04-10 NOTE — Telephone Encounter (Signed)
I agree

## 2016-04-10 NOTE — Telephone Encounter (Signed)
Left message to call Jayley Hustead at [phone]. 

## 2016-04-10 NOTE — Telephone Encounter (Addendum)
Spoke with patient. Patient states that she had "rough" intercourse last night and had 1 episode of spotting with using the restroom this morning. Denies any pain during intercourse or pain at this time. Denies any heavy bleeding. Patient did not use any form of lubrication. Reports cycles are regular. Advised patient as she is not having any current bleeding, did not have pain , or heavy bleeding it is okay to monitor for any further bleeding. Advised this can be normal with high intensity intercourse without lubrication. Advised if this persists with intercourse or develops any new symptoms she will need to be seen for further evaluation. Patient is agreeable.  Dr.Jertson, do you agree with recommendations?

## 2016-04-25 ENCOUNTER — Ambulatory Visit: Payer: Managed Care, Other (non HMO)
